# Patient Record
Sex: Female | Born: 1970 | Race: Black or African American | Hispanic: No | Marital: Single | Smoking: Never smoker
Health system: Southern US, Community
[De-identification: ages and names within clinical notes are randomized; demographics above are authoritative.]

## PROBLEM LIST (undated history)

## (undated) DIAGNOSIS — F32A Depression, unspecified: Secondary | ICD-10-CM

## (undated) DIAGNOSIS — F419 Anxiety disorder, unspecified: Secondary | ICD-10-CM

## (undated) DIAGNOSIS — F329 Major depressive disorder, single episode, unspecified: Secondary | ICD-10-CM

## (undated) HISTORY — PX: LAPAROSCOPIC ASSISTED VAGINAL HYSTERECTOMY: SHX5398

## (undated) HISTORY — PX: MYOMECTOMY: SHX85

---

## 1999-05-19 ENCOUNTER — Other Ambulatory Visit: Admission: RE | Admit: 1999-05-19 | Discharge: 1999-05-19 | Payer: Self-pay | Admitting: Obstetrics and Gynecology

## 1999-12-16 ENCOUNTER — Encounter: Payer: Self-pay | Admitting: Emergency Medicine

## 1999-12-16 ENCOUNTER — Emergency Department (HOSPITAL_COMMUNITY): Admission: EM | Admit: 1999-12-16 | Discharge: 1999-12-16 | Payer: Self-pay | Admitting: Emergency Medicine

## 2000-06-06 ENCOUNTER — Other Ambulatory Visit: Admission: RE | Admit: 2000-06-06 | Discharge: 2000-06-06 | Payer: Self-pay | Admitting: Obstetrics and Gynecology

## 2001-06-20 ENCOUNTER — Other Ambulatory Visit: Admission: RE | Admit: 2001-06-20 | Discharge: 2001-06-20 | Payer: Self-pay | Admitting: Obstetrics and Gynecology

## 2002-06-27 ENCOUNTER — Other Ambulatory Visit: Admission: RE | Admit: 2002-06-27 | Discharge: 2002-06-27 | Payer: Self-pay | Admitting: Obstetrics and Gynecology

## 2003-07-08 ENCOUNTER — Other Ambulatory Visit: Admission: RE | Admit: 2003-07-08 | Discharge: 2003-07-08 | Payer: Self-pay | Admitting: Obstetrics and Gynecology

## 2004-06-24 ENCOUNTER — Encounter: Admission: RE | Admit: 2004-06-24 | Discharge: 2004-06-24 | Payer: Self-pay | Admitting: Internal Medicine

## 2004-06-30 ENCOUNTER — Encounter: Admission: RE | Admit: 2004-06-30 | Discharge: 2004-06-30 | Payer: Self-pay | Admitting: Internal Medicine

## 2004-07-13 ENCOUNTER — Encounter: Admission: RE | Admit: 2004-07-13 | Discharge: 2004-07-13 | Payer: Self-pay | Admitting: Internal Medicine

## 2004-08-23 ENCOUNTER — Other Ambulatory Visit: Admission: RE | Admit: 2004-08-23 | Discharge: 2004-08-23 | Payer: Self-pay | Admitting: Obstetrics and Gynecology

## 2005-12-29 ENCOUNTER — Encounter (INDEPENDENT_AMBULATORY_CARE_PROVIDER_SITE_OTHER): Payer: Self-pay | Admitting: Specialist

## 2005-12-29 ENCOUNTER — Inpatient Hospital Stay (HOSPITAL_COMMUNITY): Admission: RE | Admit: 2005-12-29 | Discharge: 2006-01-01 | Payer: Self-pay | Admitting: Obstetrics and Gynecology

## 2009-03-30 ENCOUNTER — Encounter: Admission: RE | Admit: 2009-03-30 | Discharge: 2009-03-30 | Payer: Self-pay | Admitting: Obstetrics and Gynecology

## 2009-04-07 ENCOUNTER — Encounter (INDEPENDENT_AMBULATORY_CARE_PROVIDER_SITE_OTHER): Payer: Self-pay | Admitting: Obstetrics and Gynecology

## 2009-04-07 ENCOUNTER — Ambulatory Visit (HOSPITAL_COMMUNITY): Admission: RE | Admit: 2009-04-07 | Discharge: 2009-04-08 | Payer: Self-pay | Admitting: Obstetrics and Gynecology

## 2010-06-06 LAB — CBC
HCT: 31 % — ABNORMAL LOW (ref 36.0–46.0)
Hemoglobin: 10.4 g/dL — ABNORMAL LOW (ref 12.0–15.0)
Hemoglobin: 12.6 g/dL (ref 12.0–15.0)
MCV: 94.9 fL (ref 78.0–100.0)
MCV: 96.4 fL (ref 78.0–100.0)
Platelets: 200 10*3/uL (ref 150–400)
WBC: 10.7 10*3/uL — ABNORMAL HIGH (ref 4.0–10.5)

## 2010-06-06 LAB — PREGNANCY, URINE: Preg Test, Ur: NEGATIVE

## 2010-08-06 NOTE — Op Note (Signed)
NAMECERISSA, ZEIGER             ACCOUNT NO.:  [id number]   MEDICAL RECORD NO.:  [id number]          PATIENT TYPE:  AMB   LOCATION:  [facility]C                           FACILITY:  [facility]   PHYSICIAN:  Dineen Kid. Rana Snare, M.D.    DATE OF BIRTH:  May 08, 1970   DATE OF PROCEDURE:  12/29/2005  DATE OF DISCHARGE:                                 OPERATIVE REPORT   PREOPERATIVE DIAGNOSES:  1. Pelvic pain.  2. Large fibroids.  3. Menorrhagia.   POSTOPERATIVE DIAGNOSES:  1. Pelvic pain.  2. Large fibroids.  3. Menorrhagia.   PROCEDURE:  Laparotomy with multiple myomectomies.   SURGEON:  Dineen Kid. Rana Snare, M.D.   ASSISTANT:  Duke Salvia. Marcelle Overlie, M.D.   ANESTHESIA:  General endotracheal anesthesia.   INDICATIONS FOR PROCEDURE:  Ms. Makela is a 40 year old G2, P0, A2 with  worsening pelvic pain, known fibroids, worsening menorrhagia currently on  Motrin and Vicodin for pain.  No response to birth control pills.  She  desires definitive surgical intervention.  She does have childbearing  desires and presents for myomectomy.  The risks and benefits were discussed  at length.  Informed consent was obtained.  See history and physical for  further details.   FINDINGS:  Multiple large fibroids, largest 7-8 cm in size,intramural  location at the left fundus, multiple 3-4 cm fibroids, pedunculated  intramural in location.  Normal-appearing fallopian tubes and ovaries.   DESCRIPTION OF PROCEDURE:  After adequate anesthesia, the patient placed in  the supine position.  She was sterilely prepped and draped.  Bladder was  sterilely drained with Foley catheter.  A Pfannenstiel skin incision was  made 2 fingerbreadths above the pubic symphysis, taken down sharply to the  fascia which was incised transversely, extended superiorly then inferiorly  out the bellies of rectus muscle which was separated sharply in the midline.  Peritoneum was entered sharply. Uterus was identified and elevated through  the incision  and dry lap was placed around the uterus.  Multiple fibroids  were identified.  The pedunculated fibroids were removed using Bovie cautery  excising them from the stalk.  The base was coagulated with Bovie cautery,  suture ligated with 0 Monocryl suture in the muscle and a 2-0 PDS  interrupted suture in the serosa.  The large left fundal intramural fibroid  was dissected anteriorly with a Bovie.  The fibroid was grasped with a towel  clip, dissected using sharp dissection.  Once removed, the base was  reapproximated using figure-of-eights of 0 Monocryl suture.  Excessive  serosa was removed and then the serosal incision was closed with a 2-0  running suture of PDS.  Care was taken to avoid the left fallopian tube and  achieve good hemostasis.  Several 2-3 mm intramural fibroids were noted  anteriorly.  They were dissected in similar fashion using Bovie cautery  dissected two of the fibroids, grasped with towel clamps, sharply dissected  at the base, closed, reapproximated the myometrium with 0 Vicryl sutures and  closing serosa with 3-0 in a running fashion and a 3-4 cm one was dissected  in a similar  fashion at the posterior surface.  After careful reexamination,  no residual fibroids could be identified.  Good hemostasis was achieved.  Once dissected, irrigation was used and after a copious amount of irrigation  hemostasis was assured, Interceed adhesive barrier was placed anteriorly and  posteriorly.  Uterus was placed back in the peritoneal cavity.  Peritoneum  was then closed with 2-0 Monocryl in running fashion.  Rectus muscle  plicated in the midline.  The fascia was then closed with two sutures of 0  Vicryl in running fashion.  Irrigation was applied and after adequate  hemostasis, skin staples and Steri-Strips applied.  The patient tolerated  the procedure well and was stable on transfer to the recovery room.  Sponge,  needle and instrument counts were correct x3.   ESTIMATED  BLOOD LOSS:  150 mL.   Patient received 1 g cefoxitin preoperatively.      Dineen Kid Rana Snare, M.D.  Electronically Signed     DCL/MEDQ  D:  12/29/2005  T:  12/30/2005  Job:  [id number]

## 2010-08-06 NOTE — H&P (Signed)
Shirley Hill, Shirley Hill             ACCOUNT NO.:  [id number]   MEDICAL RECORD NO.:  [id number]          PATIENT TYPE:  AMB   LOCATION:  [facility]C                           [facility]ACILITY:  [facility]   PHYSICIAN:  Dineen Kid. Rana Snare, M.D.    DATE OF BIRTH:  1970-08-24   DATE OF ADMISSION:  DATE OF DISCHARGE:                                HISTORY & PHYSICAL   HISTORY OF PRESENT ILLNESS:  Shirley Hill is a 40 year old G2, P0 followed by  me for pelvic pain and uterine fibroids.  She has been having problems with  worsening menorrhagia and pelvic pain, also using Motrin and having tried  birth control pills without much symptomatic relief.  She is also having  back pain.  She does have childbearing desires of the future, although no  immediate desires.  She desires more definitive surgical intervention and  presents today for laparotomy with myomectomy.   Jina's past medical history is negative.  Past surgical history is also  negative.   PAST OB HISTORY:  She has had two pregnancies with two abortions.   MEDICATIONS:  1. Prozac 20 mg daily for general anxiety.  2. Ortho-Cyclen for dysmenorrhea and menorrhagia.   She has no known drug allergies.   PHYSICAL EXAMINATION:  Her blood pressure is 128/72, weight 153.  HEART:  Regular rate and rhythm.  LUNGS:  Clear to auscultation bilaterally.  ABDOMEN:  Nondistended, nontender.  PELVIC EXAM:  She does have a 10-week size irregular uterus which has been  consistent over the last year or two.  Ultrasound evaluation shows multiple  fibroids with the largest measuring 7 x 5.7 which is subserosal on the left.  She has other fibroids measuring 3.7, 3.2, 2.9, 2.0 and 1.8.  She has normal-  appearing ovaries and endometrial thickness of 1 cm.   IMPRESSION:  Pelvic pain, large fibroids, menorrhagia:  All of this has been  unresponsive to conservative medical management which has included  ibuprofen, oral contraceptive agents.  The patient desires more definitive  surgical intervention because it has become socially incapacitating.  She  desires to retain her childbearing abilities so we will plan laparotomy with  myomectomy.  We will discuss the procedure at length which includes but are  not limited to potential complications such as risk of infection, bleeding,  damage to the uterus, tubes, ovaries, bowel or bladder, risks associated  with anesthesia, risks associated with blood loss and blood transfusion, the  possibility that these  may recur, the pain may not improve nor the bleeding may not improve after  the surgery.  The fact that she would require cesarean section if she does  decide to carry children.  She gives her informed consent and wishes to  proceed.  Recently for pain, she has been taking double strength Anaprox and  Vicodin.      Dineen Kid Rana Snare, M.D.  Electronically Signed     DCL/MEDQ  D:  12/28/2005  T:  12/28/2005  Job:  [id number]

## 2010-08-06 NOTE — Discharge Summary (Signed)
Shirley Hill, Shirley Hill             ACCOUNT NO.:  [id number]   MEDICAL RECORD NO.:  [id number]          PATIENT TYPE:  INP   LOCATION:  9303                          [facility]ACILITY:  [facility]   PHYSICIAN:  Dineen Kid. Rana Snare, M.D.    DATE OF BIRTH:  08/01/1970   DATE OF ADMISSION:  12/29/2005  DATE OF DISCHARGE:  01/01/2006                                 DISCHARGE SUMMARY   HISTORY OF PRESENT ILLNESS:  [name] is a 40 year old G2, P0, followed  by me for pelvic pain, uterine fibroids, worsening menorrhagia and pelvic  pain over the last year without much symptomatic relief on birth control  pills or with Naprosyn.  Currently using Vicodin and Naprosyn for pelvic  pain.  She does have multiple fibroids based on ultrasound, largest measures  7 cm in size.  She desires to undergo surgical intervention.  Although she  does not have immediate child bearing desires, she does not wish  hysterectomy and presents for myomectomy.   HOSPITAL COURSE:  Same day of admission, the patient underwent a laparotomy  with multiple myomectomy with largest fibroid measuring 7-8 cm in size and  multiple 3-4 cm fibroids.  The surgery was uncomplicated.  Her estimated  blood loss was 150 cc.  Postoperative care was fairly routine with return of  bowel function and able to ambulate by the second or third day of her  postoperative care.  On postop day #1, her hemoglobin was 10.3.  By postop  day #3, she was tolerating a regular diet, ambulating without difficulty,  having regular bowel movements.  Her incision was clean, dry and intact with  normoactive bowel sounds.  Staples were removed and the patient was  discharged home.   DISPOSITION:  The patient was discharged home to follow in the office in two  weeks.  Sent home with routine instruction sheet for laparotomy, no heavy  lifting, return for increased pain, fever or bleeding.  She as given a  prescription for Tylox #30.      Dineen Kid Rana Snare, M.D.  Electronically  Signed     DCL/MEDQ  D:  01/01/2006  T:  01/02/2006  Job:  [id number]

## 2011-10-26 ENCOUNTER — Other Ambulatory Visit: Payer: Self-pay | Admitting: Obstetrics and Gynecology

## 2011-10-26 DIAGNOSIS — N63 Unspecified lump in unspecified breast: Secondary | ICD-10-CM

## 2011-11-01 ENCOUNTER — Ambulatory Visit
Admission: RE | Admit: 2011-11-01 | Discharge: 2011-11-01 | Disposition: A | Discharge status: Home / Self Care | Payer: 59 | Source: Ambulatory Visit | Attending: Obstetrics and Gynecology | Admitting: Obstetrics and Gynecology

## 2011-11-01 DIAGNOSIS — N63 Unspecified lump in unspecified breast: Secondary | ICD-10-CM

## 2012-05-01 ENCOUNTER — Other Ambulatory Visit: Payer: Self-pay | Admitting: Obstetrics and Gynecology

## 2012-05-01 DIAGNOSIS — N63 Unspecified lump in unspecified breast: Secondary | ICD-10-CM

## 2012-05-14 ENCOUNTER — Ambulatory Visit
Admission: RE | Admit: 2012-05-14 | Discharge: 2012-05-14 | Disposition: A | Discharge status: Home / Self Care | Payer: 59 | Source: Ambulatory Visit | Attending: Obstetrics and Gynecology | Admitting: Obstetrics and Gynecology

## 2012-05-14 DIAGNOSIS — N63 Unspecified lump in unspecified breast: Secondary | ICD-10-CM

## 2012-11-28 ENCOUNTER — Other Ambulatory Visit: Payer: Self-pay | Admitting: Obstetrics and Gynecology

## 2012-11-28 DIAGNOSIS — N63 Unspecified lump in unspecified breast: Secondary | ICD-10-CM

## 2012-12-04 ENCOUNTER — Ambulatory Visit
Admission: RE | Admit: 2012-12-04 | Discharge: 2012-12-04 | Disposition: A | Discharge status: Home / Self Care | Payer: 59 | Source: Ambulatory Visit | Attending: Obstetrics and Gynecology | Admitting: Obstetrics and Gynecology

## 2012-12-04 DIAGNOSIS — N63 Unspecified lump in unspecified breast: Secondary | ICD-10-CM

## 2013-05-10 ENCOUNTER — Other Ambulatory Visit: Payer: Self-pay | Admitting: Obstetrics and Gynecology

## 2013-05-10 DIAGNOSIS — N6310 Unspecified lump in the right breast, unspecified quadrant: Secondary | ICD-10-CM

## 2013-05-23 ENCOUNTER — Ambulatory Visit
Admission: RE | Admit: 2013-05-23 | Discharge: 2013-05-23 | Disposition: A | Discharge status: Home / Self Care | Payer: 59 | Source: Ambulatory Visit | Attending: Obstetrics and Gynecology | Admitting: Obstetrics and Gynecology

## 2013-05-23 DIAGNOSIS — N6310 Unspecified lump in the right breast, unspecified quadrant: Secondary | ICD-10-CM

## 2013-11-27 ENCOUNTER — Other Ambulatory Visit: Payer: Self-pay | Admitting: Obstetrics and Gynecology

## 2013-11-29 LAB — CYTOLOGY - PAP

## 2014-05-06 ENCOUNTER — Other Ambulatory Visit: Payer: Self-pay

## 2014-05-06 DIAGNOSIS — Z1231 Encounter for screening mammogram for malignant neoplasm of breast: Secondary | ICD-10-CM

## 2014-05-26 ENCOUNTER — Ambulatory Visit
Admission: RE | Admit: 2014-05-26 | Discharge: 2014-05-26 | Disposition: A | Discharge status: Home / Self Care | Payer: 59 | Source: Ambulatory Visit

## 2014-05-26 ENCOUNTER — Other Ambulatory Visit: Payer: Self-pay

## 2014-05-26 DIAGNOSIS — Z1231 Encounter for screening mammogram for malignant neoplasm of breast: Secondary | ICD-10-CM

## 2014-12-02 ENCOUNTER — Other Ambulatory Visit: Payer: Self-pay | Admitting: Obstetrics and Gynecology

## 2014-12-03 ENCOUNTER — Other Ambulatory Visit: Payer: Self-pay | Admitting: Obstetrics and Gynecology

## 2014-12-03 DIAGNOSIS — N858 Other specified noninflammatory disorders of uterus: Secondary | ICD-10-CM

## 2014-12-03 LAB — CYTOLOGY - PAP

## 2014-12-08 ENCOUNTER — Ambulatory Visit
Admission: RE | Admit: 2014-12-08 | Discharge: 2014-12-08 | Disposition: A | Discharge status: Home / Self Care | Payer: 59 | Source: Ambulatory Visit | Attending: Obstetrics and Gynecology | Admitting: Obstetrics and Gynecology

## 2014-12-08 DIAGNOSIS — N858 Other specified noninflammatory disorders of uterus: Secondary | ICD-10-CM

## 2014-12-08 MED ORDER — IOPAMIDOL (ISOVUE-300) INJECTION 61%
100.0000 mL | Freq: Once | INTRAVENOUS | Status: AC | PRN
  Administered 2014-12-08: 100 mL via INTRAVENOUS

## 2014-12-29 ENCOUNTER — Other Ambulatory Visit: Payer: Self-pay | Admitting: General Surgery

## 2014-12-29 DIAGNOSIS — R161 Splenomegaly, not elsewhere classified: Secondary | ICD-10-CM

## 2014-12-29 DIAGNOSIS — R160 Hepatomegaly, not elsewhere classified: Secondary | ICD-10-CM

## 2014-12-29 DIAGNOSIS — N9489 Other specified conditions associated with female genital organs and menstrual cycle: Secondary | ICD-10-CM

## 2015-01-04 ENCOUNTER — Ambulatory Visit
Admission: RE | Admit: 2015-01-04 | Discharge: 2015-01-04 | Disposition: A | Discharge status: Home / Self Care | Payer: 59 | Source: Ambulatory Visit | Attending: General Surgery | Admitting: General Surgery

## 2015-01-04 DIAGNOSIS — N9489 Other specified conditions associated with female genital organs and menstrual cycle: Secondary | ICD-10-CM

## 2015-01-04 DIAGNOSIS — R160 Hepatomegaly, not elsewhere classified: Secondary | ICD-10-CM

## 2015-01-04 DIAGNOSIS — R161 Splenomegaly, not elsewhere classified: Secondary | ICD-10-CM

## 2015-01-04 MED ORDER — GADOBENATE DIMEGLUMINE 529 MG/ML IV SOLN
13.0000 mL | Freq: Once | INTRAVENOUS | Status: AC | PRN
  Administered 2015-01-04: 13 mL via INTRAVENOUS

## 2015-01-05 NOTE — Progress Notes (Signed)
Quick Note:  Please let patient know that the liver and spleen lesions look like totally benign cysts that don't need surgery or follow up imaging.  The pelvic mass looks like two fibroids on the tissue near where the uterus used to be. As to what to do with those, that would be up to her, Dr. Denman George with gyn onc, and her regular GYN, Dr. Corinna Capra. ______

## 2015-01-09 ENCOUNTER — Telehealth: Payer: Self-pay

## 2015-01-09 NOTE — Telephone Encounter (Signed)
Incoming call from [place]: [phone] referring office that the patient's surgery has been cancelled. Reason for cancellation , patient will follow up with her GYN and the "mass" was benign. No further

## 2015-01-15 ENCOUNTER — Ambulatory Visit: Payer: 59 | Admitting: Gynecologic Oncology

## 2015-03-26 NOTE — Patient Instructions (Addendum)
Your procedure is scheduled on:  Tuesday, Jan. 10, 2017  Enter through the Main Entrance of [facility] at:  6:00 A.M.  Pick up the phone at the desk and dial [phone].  Call this number if you have problems the morning of surgery: [phone].  Remember: Do NOT eat food or drink after:  Midnight Monday Take these medicines the morning of surgery with a SIP OF WATER:  None  Do NOT wear jewelry (body piercing), metal hair clips/bobby pins, make-up, or nail polish. Do NOT wear lotions, powders, or perfumes.  You may wear deoderant. Do NOT shave for 48 hours prior to surgery. Do NOT bring valuables to the hospital. Contacts, dentures, or bridgework may not be worn into surgery. Leave suitcase in car.  After surgery it may be brought to your room.  For patients admitted to the hospital, checkout time is 11:00 AM the day of discharge.

## 2015-03-27 ENCOUNTER — Encounter (HOSPITAL_COMMUNITY): Payer: Self-pay

## 2015-03-27 ENCOUNTER — Encounter (HOSPITAL_COMMUNITY)
Admission: RE | Admit: 2015-03-27 | Discharge: 2015-03-27 | Disposition: A | Discharge status: Home / Self Care | Payer: 59 | Source: Ambulatory Visit | Attending: Obstetrics and Gynecology | Admitting: Obstetrics and Gynecology

## 2015-03-27 DIAGNOSIS — Z01812 Encounter for preprocedural laboratory examination: Secondary | ICD-10-CM | POA: Diagnosis not present

## 2015-03-27 DIAGNOSIS — R1909 Other intra-abdominal and pelvic swelling, mass and lump: Secondary | ICD-10-CM | POA: Diagnosis not present

## 2015-03-27 HISTORY — DX: Anxiety disorder, unspecified: F41.9

## 2015-03-27 HISTORY — DX: Major depressive disorder, single episode, unspecified: F32.9

## 2015-03-27 HISTORY — DX: Depression, unspecified: F32.A

## 2015-03-27 LAB — CBC
HCT: 34.9 % — ABNORMAL LOW (ref 36.0–46.0)
Hemoglobin: 11.8 g/dL — ABNORMAL LOW (ref 12.0–15.0)
MCH: 31.1 pg (ref 26.0–34.0)
MCHC: 33.8 g/dL (ref 30.0–36.0)
MCV: 92.1 fL (ref 78.0–100.0)
Platelets: 249 10*3/uL (ref 150–400)
RBC: 3.79 MIL/uL — ABNORMAL LOW (ref 3.87–5.11)
RDW: 14.1 % (ref 11.5–15.5)
WBC: 8.9 10*3/uL (ref 4.0–10.5)

## 2015-03-30 MED ORDER — DEXTROSE 5 % IV SOLN
2.0000 g | INTRAVENOUS | Status: AC
  Administered 2015-03-31: 2 g via INTRAVENOUS
  Filled 2015-03-30: qty 2

## 2015-03-30 NOTE — H&P (Addendum)
  Shirley Hill [id number] 03/27/2015 S:  Shirley Hill presents today for a preop evaluation for laparotomy and evaluation of right adnexal mass.  Shirley Hill was recently here for an annual exam in September, was not having any problems at all.  She did have a hysterectomy in the past for fibroids.  On pelvic exam, we felt a moderate-sized adnexal mass 3 to 4 cm in size in the right adnexa which was nontender.  Heart was regular rate and rhythm.  Lungs are clear to auscultation bilaterally.  Abdomen was  nondistended and nontender. .  We did proceed with an ultrasound evaluation that showed a 4 cm adnexal mass consistent with a fibroid.  She had followup with a general surgeon and also ended up with a CAT scan and MRI.  The MRI of the pelvis showed a 4.5 x 4.3 cm broad ligament fibroid and also a 2.3 cm broad ligament fibroid in addition to her ovary.  There is no evidence of any type of malignancy.  No lymphadenopathy.  No suspicions for any type of metastatic disease.  After discussion with the general surgeon and evaluation by the general surgeon, also discussion with GYN oncologist, they did recommend surgical evaluation and removal of these, and she presents for this.    A/P:  Right adnexal masses status post hysterectomy most consistent with broad ligament fibroids.  Plan laparotomy with right salpingo-oophorectomy and removal of adnexal masses.  Discussed the risks and benefits, the pros and cons, recovery of this, and all of her questions were answered.  She does give her informed consent.   Louretta Shorten, MD/[id number]  03/31/15 0715 This patient has been seen and examined.   All of her questions were answered.  Labs and vital signs reviewed.  Informed consent has been obtained.  The History and Physical is current. DL

## 2015-03-31 ENCOUNTER — Encounter (HOSPITAL_COMMUNITY)
Admission: RE | Disposition: A | Discharge status: Home / Self Care | Payer: Self-pay | Source: Ambulatory Visit | Attending: Obstetrics and Gynecology

## 2015-03-31 ENCOUNTER — Encounter (HOSPITAL_COMMUNITY): Payer: Self-pay

## 2015-03-31 ENCOUNTER — Inpatient Hospital Stay (HOSPITAL_COMMUNITY)
Admission: RE | Admit: 2015-03-31 | Discharge: 2015-04-02 | DRG: 743 | Disposition: A | Discharge status: Home / Self Care | Payer: 59 | Source: Ambulatory Visit | Attending: Obstetrics and Gynecology | Admitting: Obstetrics and Gynecology

## 2015-03-31 ENCOUNTER — Inpatient Hospital Stay (HOSPITAL_COMMUNITY): Payer: 59 | Admitting: Anesthesiology

## 2015-03-31 DIAGNOSIS — D270 Benign neoplasm of right ovary: Secondary | ICD-10-CM | POA: Diagnosis present

## 2015-03-31 DIAGNOSIS — R1900 Intra-abdominal and pelvic swelling, mass and lump, unspecified site: Secondary | ICD-10-CM | POA: Diagnosis present

## 2015-03-31 DIAGNOSIS — Z90710 Acquired absence of both cervix and uterus: Secondary | ICD-10-CM

## 2015-03-31 DIAGNOSIS — Z9889 Other specified postprocedural states: Secondary | ICD-10-CM

## 2015-03-31 HISTORY — PX: LAPAROTOMY: SHX154

## 2015-03-31 HISTORY — PX: SALPINGOOPHORECTOMY: SHX82

## 2015-03-31 SURGERY — LAPAROTOMY, EXPLORATORY
Anesthesia: General | Site: Abdomen | Laterality: Right

## 2015-03-31 MED ORDER — SCOPOLAMINE 1 MG/3DAYS TD PT72
MEDICATED_PATCH | TRANSDERMAL | Status: AC
  Administered 2015-03-31: 1.5 mg via TRANSDERMAL
  Filled 2015-03-31: qty 1

## 2015-03-31 MED ORDER — SUGAMMADEX SODIUM 500 MG/5ML IV SOLN
INTRAVENOUS | Status: AC
  Filled 2015-03-31: qty 5

## 2015-03-31 MED ORDER — HYDROMORPHONE HCL 1 MG/ML IJ SOLN: 0.2000 mg | INTRAMUSCULAR | Status: DC | PRN

## 2015-03-31 MED ORDER — HYDROMORPHONE HCL 1 MG/ML IJ SOLN
INTRAMUSCULAR | Status: AC
  Administered 2015-03-31: 0.5 mg via INTRAVENOUS
  Filled 2015-03-31: qty 1

## 2015-03-31 MED ORDER — MIDAZOLAM HCL 2 MG/2ML IJ SOLN
INTRAMUSCULAR | Status: DC | PRN
  Administered 2015-03-31: 2 mg via INTRAVENOUS

## 2015-03-31 MED ORDER — DIPHENHYDRAMINE HCL 50 MG/ML IJ SOLN: 12.5000 mg | Freq: Four times a day (QID) | INTRAMUSCULAR | Status: DC | PRN

## 2015-03-31 MED ORDER — NALOXONE HCL 0.4 MG/ML IJ SOLN: 0.4000 mg | INTRAMUSCULAR | Status: DC | PRN

## 2015-03-31 MED ORDER — PROPOFOL 10 MG/ML IV BOLUS
INTRAVENOUS | Status: DC | PRN
  Administered 2015-03-31: 20 mg via INTRAVENOUS

## 2015-03-31 MED ORDER — DIPHENHYDRAMINE HCL 12.5 MG/5ML PO ELIX: 12.5000 mg | ORAL_SOLUTION | Freq: Four times a day (QID) | ORAL | Status: DC | PRN

## 2015-03-31 MED ORDER — LACTATED RINGERS IV SOLN
INTRAVENOUS | Status: DC
  Administered 2015-03-31 (×2): via INTRAVENOUS

## 2015-03-31 MED ORDER — MIDAZOLAM HCL 2 MG/2ML IJ SOLN
INTRAMUSCULAR | Status: AC
  Filled 2015-03-31: qty 2

## 2015-03-31 MED ORDER — HYDROMORPHONE HCL 1 MG/ML IJ SOLN
0.2500 mg | INTRAMUSCULAR | Status: DC | PRN
  Administered 2015-03-31 (×3): 0.5 mg via INTRAVENOUS

## 2015-03-31 MED ORDER — LIDOCAINE HCL (CARDIAC) 20 MG/ML IV SOLN
INTRAVENOUS | Status: AC
  Filled 2015-03-31: qty 5

## 2015-03-31 MED ORDER — SCOPOLAMINE 1 MG/3DAYS TD PT72
1.0000 | MEDICATED_PATCH | Freq: Once | TRANSDERMAL | Status: DC
  Administered 2015-03-31: 1.5 mg via TRANSDERMAL

## 2015-03-31 MED ORDER — MENTHOL 3 MG MT LOZG: 1.0000 | LOZENGE | OROMUCOSAL | Status: DC | PRN

## 2015-03-31 MED ORDER — ONDANSETRON HCL 4 MG/2ML IJ SOLN: 4.0000 mg | Freq: Four times a day (QID) | INTRAMUSCULAR | Status: DC | PRN

## 2015-03-31 MED ORDER — ZOLPIDEM TARTRATE 5 MG PO TABS: 5.0000 mg | ORAL_TABLET | Freq: Every evening | ORAL | Status: DC | PRN

## 2015-03-31 MED ORDER — DEXAMETHASONE SODIUM PHOSPHATE 4 MG/ML IJ SOLN
INTRAMUSCULAR | Status: AC
  Filled 2015-03-31: qty 1

## 2015-03-31 MED ORDER — IBUPROFEN 600 MG PO TABS
600.0000 mg | ORAL_TABLET | Freq: Four times a day (QID) | ORAL | Status: DC | PRN
  Administered 2015-04-02: 600 mg via ORAL
  Filled 2015-03-31: qty 1

## 2015-03-31 MED ORDER — SODIUM CHLORIDE 0.9 % IJ SOLN: 9.0000 mL | INTRAMUSCULAR | Status: DC | PRN

## 2015-03-31 MED ORDER — HYDROMORPHONE 1 MG/ML IV SOLN
INTRAVENOUS | Status: DC
  Administered 2015-03-31: 2.8 mg via INTRAVENOUS
  Administered 2015-03-31: 11:00:00 via INTRAVENOUS
  Administered 2015-04-01: 1 mg via INTRAVENOUS
  Administered 2015-04-01: 1.4 mg via INTRAVENOUS
  Filled 2015-03-31: qty 25

## 2015-03-31 MED ORDER — KETOROLAC TROMETHAMINE 30 MG/ML IJ SOLN
INTRAMUSCULAR | Status: AC
  Filled 2015-03-31: qty 1

## 2015-03-31 MED ORDER — OXYCODONE-ACETAMINOPHEN 5-325 MG PO TABS
1.0000 | ORAL_TABLET | ORAL | Status: DC | PRN
  Administered 2015-04-01 – 2015-04-02 (×6): 2 via ORAL
  Filled 2015-03-31 (×6): qty 2

## 2015-03-31 MED ORDER — FENTANYL CITRATE (PF) 100 MCG/2ML IJ SOLN
INTRAMUSCULAR | Status: DC | PRN
  Administered 2015-03-31: 50 ug via INTRAVENOUS
  Administered 2015-03-31: 25 ug via INTRAVENOUS
  Administered 2015-03-31: 100 ug via INTRAVENOUS
  Administered 2015-03-31: 50 ug via INTRAVENOUS
  Administered 2015-03-31 (×2): 100 ug via INTRAVENOUS

## 2015-03-31 MED ORDER — ESCITALOPRAM OXALATE 5 MG PO TABS
5.0000 mg | ORAL_TABLET | ORAL | Status: DC | PRN
  Filled 2015-03-31: qty 1

## 2015-03-31 MED ORDER — LIDOCAINE HCL (CARDIAC) 20 MG/ML IV SOLN
INTRAVENOUS | Status: DC | PRN
  Administered 2015-03-31: 100 mg via INTRAVENOUS
  Administered 2015-03-31: 180 mg via INTRAVENOUS

## 2015-03-31 MED ORDER — ONDANSETRON HCL 4 MG/2ML IJ SOLN
INTRAMUSCULAR | Status: DC | PRN
  Administered 2015-03-31: 4 mg via INTRAVENOUS

## 2015-03-31 MED ORDER — DEXTROSE-NACL 5-0.45 % IV SOLN
INTRAVENOUS | Status: DC
  Administered 2015-03-31 (×2): via INTRAVENOUS
  Administered 2015-04-01: 125 mL/h via INTRAVENOUS

## 2015-03-31 MED ORDER — CYCLOBENZAPRINE HCL 10 MG PO TABS
10.0000 mg | ORAL_TABLET | Freq: Three times a day (TID) | ORAL | Status: DC | PRN
  Filled 2015-03-31: qty 1

## 2015-03-31 MED ORDER — ROCURONIUM BROMIDE 100 MG/10ML IV SOLN
INTRAVENOUS | Status: DC | PRN
  Administered 2015-03-31: 50 mg via INTRAVENOUS

## 2015-03-31 MED ORDER — ONDANSETRON HCL 4 MG/2ML IJ SOLN
INTRAMUSCULAR | Status: AC
  Filled 2015-03-31: qty 2

## 2015-03-31 MED ORDER — DEXAMETHASONE SODIUM PHOSPHATE 10 MG/ML IJ SOLN
INTRAMUSCULAR | Status: DC | PRN
  Administered 2015-03-31: 10 mg via INTRAVENOUS

## 2015-03-31 MED ORDER — SUGAMMADEX SODIUM 200 MG/2ML IV SOLN
INTRAVENOUS | Status: DC | PRN
  Administered 2015-03-31: 80 mg via INTRAVENOUS
  Administered 2015-03-31 (×2): 100 mg via INTRAVENOUS

## 2015-03-31 MED ORDER — ROCURONIUM BROMIDE 100 MG/10ML IV SOLN
INTRAVENOUS | Status: AC
  Filled 2015-03-31: qty 1

## 2015-03-31 MED ORDER — FENTANYL CITRATE (PF) 250 MCG/5ML IJ SOLN
INTRAMUSCULAR | Status: AC
  Filled 2015-03-31: qty 5

## 2015-03-31 MED ORDER — HYDROMORPHONE HCL 1 MG/ML IJ SOLN
INTRAMUSCULAR | Status: AC
  Filled 2015-03-31: qty 1

## 2015-03-31 MED ORDER — KETOROLAC TROMETHAMINE 30 MG/ML IJ SOLN
INTRAMUSCULAR | Status: DC | PRN
  Administered 2015-03-31: 30 mg via INTRAVENOUS

## 2015-03-31 MED ORDER — PROPOFOL 10 MG/ML IV BOLUS
INTRAVENOUS | Status: AC
  Filled 2015-03-31: qty 20

## 2015-03-31 SURGICAL SUPPLY — 45 items
APL SKNCLS STERI-STRIP NONHPOA (GAUZE/BANDAGES/DRESSINGS) ×1
BARRIER ADHS 3X4 INTERCEED (GAUZE/BANDAGES/DRESSINGS) IMPLANT
BENZOIN TINCTURE PRP APPL 2/3 (GAUZE/BANDAGES/DRESSINGS) ×1 IMPLANT
BLADE SURG 10 STRL SS (BLADE) ×2 IMPLANT
BRR ADH 4X3 ABS CNTRL BYND (GAUZE/BANDAGES/DRESSINGS)
CANISTER SUCT 3000ML (MISCELLANEOUS) ×2 IMPLANT
CELLS DAT CNTRL 66122 CELL SVR (MISCELLANEOUS) ×1 IMPLANT
CLOTH BEACON ORANGE TIMEOUT ST (SAFETY) ×2 IMPLANT
DECANTER SPIKE VIAL GLASS SM (MISCELLANEOUS) IMPLANT
DRAPE WARM FLUID 44X44 (DRAPE) IMPLANT
DRSG OPSITE POSTOP 4X10 (GAUZE/BANDAGES/DRESSINGS) ×1 IMPLANT
GAUZE SPONGE 4X4 16PLY XRAY LF (GAUZE/BANDAGES/DRESSINGS) IMPLANT
GLOVE BIO SURGEON STRL SZ8 (GLOVE) ×2 IMPLANT
GLOVE BIOGEL PI IND STRL 7.0 (GLOVE) ×1 IMPLANT
GLOVE BIOGEL PI INDICATOR 7.0 (GLOVE) ×1
GLOVE SURG ORTHO 8.0 STRL STRW (GLOVE) ×2 IMPLANT
GOWN STRL REUS W/TWL LRG LVL3 (GOWN DISPOSABLE) ×4 IMPLANT
NEEDLE HYPO 22GX1.5 SAFETY (NEEDLE) IMPLANT
NS IRRIG 1000ML POUR BTL (IV SOLUTION) ×2 IMPLANT
PACK ABDOMINAL GYN (CUSTOM PROCEDURE TRAY) ×2 IMPLANT
PAD OB MATERNITY 4.3X12.25 (PERSONAL CARE ITEMS) ×2 IMPLANT
PENCIL SMOKE EVAC W/HOLSTER (ELECTROSURGICAL) ×1 IMPLANT
PROTECTOR NERVE ULNAR (MISCELLANEOUS) ×2 IMPLANT
RETRACTOR WND ALEXIS 18 MED (MISCELLANEOUS) IMPLANT
RETRACTOR WND ALEXIS 25 LRG (MISCELLANEOUS) IMPLANT
RTRCTR WOUND ALEXIS 18CM MED (MISCELLANEOUS) ×2
RTRCTR WOUND ALEXIS 25CM LRG (MISCELLANEOUS)
SPONGE LAP 18X18 X RAY DECT (DISPOSABLE) ×1 IMPLANT
STAPLER VISISTAT 35W (STAPLE) ×1 IMPLANT
STRIP CLOSURE SKIN 1/2X4 (GAUZE/BANDAGES/DRESSINGS) ×1 IMPLANT
SUT CHROMIC 3 0 SH 27 (SUTURE) IMPLANT
SUT CHROMIC 3 0 TIES (SUTURE) IMPLANT
SUT MON AB 4-0 PS1 27 (SUTURE) ×1 IMPLANT
SUT PDS AB 3-0 SH 27 (SUTURE) IMPLANT
SUT VIC AB 0 CT1 18XCR BRD8 (SUTURE) IMPLANT
SUT VIC AB 0 CT1 8-18 (SUTURE)
SUT VIC AB 1 CT1 27 (SUTURE) ×4
SUT VIC AB 1 CT1 27XBRD ANTBC (SUTURE) IMPLANT
SUT VIC AB 1 CTB1 27 (SUTURE) ×2 IMPLANT
SUT VIC AB 2-0 CT1 (SUTURE) ×2 IMPLANT
SUT VICRYL 0 TIES 12 18 (SUTURE) ×2 IMPLANT
SYR CONTROL 10ML LL (SYRINGE) IMPLANT
TOWEL OR 17X24 6PK STRL BLUE (TOWEL DISPOSABLE) ×4 IMPLANT
TRAY FOLEY CATH SILVER 14FR (SET/KITS/TRAYS/PACK) ×2 IMPLANT
WATER STERILE IRR 1000ML POUR (IV SOLUTION) ×1 IMPLANT

## 2015-03-31 NOTE — Anesthesia Postprocedure Evaluation (Signed)
Anesthesia Post Note  Patient: Shirley Hill  Procedure(s) Performed: Procedure(s) (LRB): EXPLORATORY LAPAROTOMY WITH REMOVAL OF RIGHT ADNEXAL MASSES (Right) RIGHT SALPINGO OOPHORECTOMY (Right)  Patient location during evaluation: PACU Anesthesia Type: General Level of consciousness: awake Pain management: pain level controlled Respiratory status: spontaneous breathing Cardiovascular status: stable Anesthetic complications: no    Last Vitals:  Filed Vitals:   03/31/15 0845 03/31/15 0900  BP: 141/95 139/80  Pulse: 69 70  Temp:    Resp: 17 16    Last Pain:  Filed Vitals:   03/31/15 0921  PainSc: 5                  EDWARDS,[name]

## 2015-03-31 NOTE — Anesthesia Procedure Notes (Signed)
Procedure Name: Intubation Date/Time: 03/31/2015 7:37 AM Performed by: Casimer Lanius A Pre-anesthesia Checklist: Patient identified, Emergency Drugs available, Suction available and Patient being monitored Patient Re-evaluated:Patient Re-evaluated prior to inductionOxygen Delivery Method: Circle system utilized and Simple face mask Preoxygenation: Pre-oxygenation with 100% oxygen Intubation Type: IV induction Ventilation: Mask ventilation without difficulty Laryngoscope Size: Mac and 3 Grade View: Grade II Tube type: Oral Tube size: 7.0 mm Number of attempts: 1 Airway Equipment and Method: Stylet Placement Confirmation: ETT inserted through vocal cords under direct vision,  positive ETCO2 and breath sounds checked- equal and bilateral Secured at: 21 (right lip) cm Tube secured with: Tape Dental Injury: Teeth and Oropharynx as per pre-operative assessment

## 2015-03-31 NOTE — Op Note (Signed)
[name], Shirley NO.:  [id number]  MEDICAL RECORD NO.:  [id number]  LOCATION:  [facility]                          FACILITY:  [place]  PHYSICIAN:  Monia Sabal. Corinna Capra, M.D.    DATE OF BIRTH:  [birth year]  DATE OF PROCEDURE:  03/31/2015 DATE OF DISCHARGE:                              OPERATIVE REPORT   PREOPERATIVE DIAGNOSIS:  Right adnexal masses status post hysterectomy consistent with broad ligament fibroids.  POSTOPERATIVE DIAGNOSIS:  Right adnexal masses status post hysterectomy consistent with broad ligament fibroids.  PROCEDURE:  Laparotomy with right salpingo-oophorectomy and removal of right adnexal masses.  SURGEON:  Monia Sabal. Corinna Capra, M.D.  ASSISTANT:  Dr. Enzo Bi.  ANESTHESIA:  General endotracheal.  INDICATIONS:  Shirley Hill is a 45 year old black female with a history of myomectomy in the past, also a hysterectomy, having no problems until she had recent pelvic exam on her annual exam and noted two right adnexal masses with ultrasound, and also CT and MRI of the pelvis showing a 4.5-cm broad ligament fibroid and also a 2.3-cm broad ligament fibroid in addition to her right ovary.  She presents for definitive surgical evaluation and removal of these, plan out laparotomy with removal of the adnexal mass in the right tube and ovary.  Risks and benefits of the procedure were discussed at length and informed consent was obtained.  FINDINGS AT THE TIME OF SURGERY:  Normal-appearing appendix.  Normal- appearing left tube and ovary.  Right tube and ovary appeared to be normal.  There were 2 right broad ligament masses consistent with leiomyoma.  DESCRIPTION OF PROCEDURE:  After adequate analgesia, the patient was placed in a supine position.  She was sterilely prepped and draped. Bladder sterilely drained with a Foley catheter.  A sterile sponge was placed intravaginally.  A Pfannenstiel skin incision was made 2 fingerbreadths above the pubic symphysis and  taken down sharply.  The fascia was incised transversely and extended superiorly and inferiorly up the belly of the rectus muscle which were separated sharply in the midline.  Peritoneum was entered sharply.  An Fredia Sorrow retractor was placed.  Bowel was packed cephalad.  The right round ligament was identified, grasped, suture ligated, and the right broad ligament was opened identifying the fibroids.  The large fibroid which was grasped with a tenaculum was elevated and was bluntly and sharply dissected free from the broad ligament.  The base was then clamped with a Haney clamp, and it was removed.  The blood supply was then ligated with 0 Vicryl suture and the second fibroid was similarly identified next to the right ovary.  It was grasped with the right ovary and the infundibulopelvic ligament was identified, clamped with a Heaney clamp across the IFP and removed.  The infundibulopelvic ligament was then ligated with 2 sutures of 0 Vicryl with good hemostasis noted.  Careful evaluation of the right adnexa at this time revealed good hemostasis. No evidence of any other fibroids or any other type of pathology.  We were well away from the ureter, and we were able to palpate and visualize the right ureter with good peristalsis away from the line of dissection.  Left tube and ovary were  identified and appeared to be normal.  No evidence of left broad ligament fibroids were noted. Evaluation of the bladder and the rectum revealed normal anatomy.  The appendix appeared to be normal.  The pelvis was then irrigated.  The hemostasis was excellent.  The packing was then removed.  The retractors were removed.  The peritoneum was closed with 0 Vicryl in a running fashion.  The rectus muscle plicated in the midline.  The fascia was then closed with a running suture of #1 Vicryl.  Irrigation was applied after adequate hemostasis.  The skin was closed with a 4-0 Monocryl subcutaneous suture  with good approximation and good hemostasis noted. Tegaderm was then placed, and the patient was transferred to recovery room in stable condition.  Sponge and instrument count was normal x3. Estimated blood loss was less than 50 mL.  The patient received 2 g of cefotetan preoperatively and 30 mg of Toradol IV intraoperatively.     Monia Sabal Corinna Capra, M.D.     DCL/MEDQ  D:  03/31/2015  T:  03/31/2015  Job:  [id number]

## 2015-03-31 NOTE — Addendum Note (Signed)
Addendum  created 03/31/15 2115 by Flossie Dibble, CRNA   Modules edited: Notes Section   Notes Section:  File: [id number]

## 2015-03-31 NOTE — Brief Op Note (Signed)
03/31/2015  8:22 AM  PATIENT:  Shirley Hill  45 y.o. female  PRE-OPERATIVE DIAGNOSIS:  right broad ligament fibroids  POST-OPERATIVE DIAGNOSIS:  right broad ligament fibroids  PROCEDURE:  Procedure(s): EXPLORATORY LAPAROTOMY WITH REMOVAL OF RIGHT ADNEXAL MASSES (Right) RIGHT SALPINGO OOPHORECTOMY (Right)  SURGEON:  Surgeon(s) and Role:    * Louretta Shorten, MD - Primary    * Linda Hedges, DO - Assisting  PHYSICIAN ASSISTANT:   ASSISTANTS: Morris   ANESTHESIA:   general  EBL:  Total I/O In: 1000 [I.V.:1000] Out: 150 [Urine:100; Blood:50]  BLOOD ADMINISTERED:none  DRAINS: Urinary Catheter (Foley)   LOCAL MEDICATIONS USED:  NONE  SPECIMEN:  Source of Specimen:  Right broad ligament masses and right tube and ovary  DISPOSITION OF SPECIMEN:  PATHOLOGY  COUNTS:  YES  TOURNIQUET:  * No tourniquets in log *  DICTATION: .Other Dictation: Dictation Number 1  PLAN OF CARE: Admit to inpatient   PATIENT DISPOSITION:  PACU - hemodynamically stable.   Delay start of Pharmacological VTE agent (>24hrs) due to surgical blood loss or risk of bleeding: not applicable

## 2015-03-31 NOTE — Anesthesia Preprocedure Evaluation (Signed)
Anesthesia Evaluation  Patient identified by MRN, date of birth, ID band Patient awake    Reviewed: Allergy & Precautions, NPO status   Airway Mallampati: II  TM Distance: >3 FB Neck ROM: Full    Dental   Pulmonary neg pulmonary ROS,    breath sounds clear to auscultation       Cardiovascular negative cardio ROS   Rhythm:Regular Rate:Normal     Neuro/Psych    GI/Hepatic negative GI ROS, Neg liver ROS,   Endo/Other  negative endocrine ROS  Renal/GU negative Renal ROS     Musculoskeletal   Abdominal   Peds  Hematology   Anesthesia Other Findings   Reproductive/Obstetrics                             Anesthesia Physical Anesthesia Plan  ASA: II  Anesthesia Plan: General   Post-op Pain Management:    Induction: Intravenous  Airway Management Planned: Oral ETT  Additional Equipment:   Intra-op Plan:   Post-operative Plan: Extubation in OR  Informed Consent: I have reviewed the patients History and Physical, chart, labs and discussed the procedure including the risks, benefits and alternatives for the proposed anesthesia with the patient or authorized representative who has indicated his/her understanding and acceptance.   Dental advisory given  Plan Discussed with: CRNA and Anesthesiologist  Anesthesia Plan Comments:         Anesthesia Quick Evaluation

## 2015-03-31 NOTE — Anesthesia Postprocedure Evaluation (Signed)
Anesthesia Post Note  Patient: Shirley Hill  Procedure(s) Performed: Procedure(s) (LRB): EXPLORATORY LAPAROTOMY WITH REMOVAL OF RIGHT ADNEXAL MASSES (Right) RIGHT SALPINGO OOPHORECTOMY (Right)  Patient location during evaluation: [facility] Anesthesia Type: General Level of consciousness: awake and alert Pain management: satisfactory to patient Vital Signs Assessment: post-procedure vital signs reviewed and stable Respiratory status: spontaneous breathing and respiratory function stable Cardiovascular status: stable Postop Assessment: adequate PO intake Anesthetic complications: no    Last Vitals:  Filed Vitals:   03/31/15 1835 03/31/15 2056  BP: 105/57   Pulse: 60 73  Temp: 36.8 C   Resp: 14 13    Last Pain:  Filed Vitals:   03/31/15 2057  PainSc: 3                  Keili Hasten

## 2015-03-31 NOTE — Transfer of Care (Signed)
Immediate Anesthesia Transfer of Care Note  Patient: Shirley Hill  Procedure(s) Performed: Procedure(s): EXPLORATORY LAPAROTOMY WITH REMOVAL OF RIGHT ADNEXAL MASSES (Right) RIGHT SALPINGO OOPHORECTOMY (Right)  Patient Location: PACU  Anesthesia Type:General  Level of Consciousness: awake  Airway & Oxygen Therapy: Patient Spontanous Breathing  Post-op Assessment: Report given to PACU RN  Post vital signs: stable  Filed Vitals:   03/31/15 0631  BP: 0000000    Complications: No apparent anesthesia complications

## 2015-04-01 ENCOUNTER — Encounter (HOSPITAL_COMMUNITY): Payer: Self-pay | Admitting: Obstetrics and Gynecology

## 2015-04-01 LAB — CBC
HEMATOCRIT: 28.8 % — AB (ref 36.0–46.0)
HEMOGLOBIN: 9.6 g/dL — AB (ref 12.0–15.0)
MCH: 31.2 pg (ref 26.0–34.0)
MCHC: 33.3 g/dL (ref 30.0–36.0)
MCV: 93.5 fL (ref 78.0–100.0)
Platelets: 224 10*3/uL (ref 150–400)
RBC: 3.08 MIL/uL — ABNORMAL LOW (ref 3.87–5.11)
RDW: 14.3 % (ref 11.5–15.5)
WBC: 11.1 10*3/uL — ABNORMAL HIGH (ref 4.0–10.5)

## 2015-04-01 NOTE — Progress Notes (Signed)
1 Day Post-Op Procedure(s) (LRB): EXPLORATORY LAPAROTOMY WITH REMOVAL OF RIGHT ADNEXAL MASSES (Right) RIGHT SALPINGO OOPHORECTOMY (Right)  Subjective: Patient reports tolerating PO and no problems voiding.    Objective: I have reviewed patient's vital signs, intake and output, medications and labs.  General: alert, cooperative, appears stated age and no distress GI: soft, non-tender; bowel sounds normal; no masses,  no organomegaly Vaginal Bleeding: none  Assessment: s/p Procedure(s): EXPLORATORY LAPAROTOMY WITH REMOVAL OF RIGHT ADNEXAL MASSES (Right) RIGHT SALPINGO OOPHORECTOMY (Right): stable and progressing well  Plan: Advance diet Encourage ambulation Advance to PO medication Discontinue IV fluids  LOS: 1 day    Bishoy Cupp C 04/01/2015, 8:52 AM

## 2015-04-02 MED ORDER — OXYCODONE-ACETAMINOPHEN 5-325 MG PO TABS: 1.0000 | ORAL_TABLET | ORAL | Status: AC | PRN

## 2015-04-02 MED ORDER — ZOLPIDEM TARTRATE ER 12.5 MG PO TBCR: 12.5000 mg | EXTENDED_RELEASE_TABLET | Freq: Every evening | ORAL | Status: AC | PRN

## 2015-04-02 MED ORDER — IBUPROFEN 600 MG PO TABS: 600.0000 mg | ORAL_TABLET | Freq: Four times a day (QID) | ORAL | Status: AC | PRN

## 2015-04-02 NOTE — Discharge Summary (Signed)
Physician Discharge Summary  Patient ID: Shirley Hill MRN: [id number] DOB/AGE: 23-Sep-1970 45 y.o.  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 Her surgery was uncomplicated with 0000000 EBL and her post op care was unremarkable with quick return of bowel and bladder function, tolerating po meds and food and d/c d home on post op D 2  Consults: None  Significant Diagnostic Studies: labs: Hgb 9.6  Treatments: surgery: as above  Discharge Exam: Blood pressure 139/83, pulse 72, temperature 98.1 F (36.7 C), temperature source Oral, resp. rate 18, height 5\' 6"  (1.676 m), weight 156 lb (70.761 kg), SpO2 100 %. General appearance: alert, cooperative, appears stated age and no distress GI: soft, non-tender; bowel sounds normal; no masses,  no organomegaly Incision/Wound:CD&I  Disposition:   Discharge Instructions    Call MD for:  difficulty breathing, headache or visual disturbances    Complete by:  As directed      Call MD for:  persistant nausea and vomiting    Complete by:  As directed      Call MD for:  redness, tenderness, or signs of infection (pain, swelling, redness, odor or green/yellow discharge around incision site)    Complete by:  As directed      Call MD for:  severe uncontrolled pain    Complete by:  As directed      Call MD for:  temperature >100.4    Complete by:  As directed      Diet general    Complete by:  As directed      Driving Restrictions    Complete by:  As directed   No driving for 2 weeks     Increase activity slowly    Complete by:  As directed      Lifting restrictions    Complete by:  As directed   No lifting anything greater than 10 pounds (if you have to ask, don't lift it)     Sexual Activity  Restrictions    Complete by:  As directed   Nothing in the vagina for 6 weeks            Medication List    STOP taking these medications        zaleplon 10 MG capsule  Commonly known as:  SONATA      TAKE these medications        cyclobenzaprine 10 MG tablet  Commonly known as:  FLEXERIL  Take 10 mg by mouth 3 (three) times daily as needed for muscle spasms.     diclofenac 50 MG EC tablet  Commonly known as:  VOLTAREN  Take 50 mg by mouth as needed for moderate pain.     escitalopram 5 MG tablet  Commonly known as:  LEXAPRO  Take 5 mg by mouth as needed (anxiety).     ibuprofen 600 MG tablet  Commonly known as:  ADVIL,MOTRIN  Take 1 tablet (600 mg total) by mouth every 6 (six) hours as needed (mild pain).     oxyCODONE-acetaminophen 5-325 MG tablet  Commonly known as:  PERCOCET/ROXICET  Take 1-2 tablets by mouth every 4 (four) hours as needed for severe pain (moderate to severe pain (when tolerating fluids)).     zolpidem 10 MG tablet  Commonly known as:  AMBIEN  Take  10 mg by mouth at bedtime as needed for sleep.     zolpidem 12.5 MG CR tablet  Commonly known as:  AMBIEN CR  Take 1 tablet (12.5 mg total) by mouth at bedtime as needed for sleep.         Signed: Johany Hansman C 04/02/2015, 7:57 AM

## 2015-04-02 NOTE — Progress Notes (Signed)
2 Days Post-Op Procedure(s) (LRB): EXPLORATORY LAPAROTOMY WITH REMOVAL OF RIGHT ADNEXAL MASSES (Right) RIGHT SALPINGO OOPHORECTOMY (Right)  Subjective: Patient reports tolerating PO, + flatus, + BM and no problems voiding.    Objective: I have reviewed patient's vital signs, intake and output, medications and labs.  General: alert, cooperative, appears stated age and no distress GI: soft, non-tender; bowel sounds normal; no masses,  no organomegaly  Assessment: s/p Procedure(s): EXPLORATORY LAPAROTOMY WITH REMOVAL OF RIGHT ADNEXAL MASSES (Right) RIGHT SALPINGO OOPHORECTOMY (Right): stable and progressing well  Plan: Discharge home  LOS: 2 days    Shirley Hill C 04/02/2015, 7:54 AM

## 2015-04-02 NOTE — Progress Notes (Signed)
Pt is discharged in the care of Parents. Downstairs per ambulatory with N.T. Escort. Denies any pain or discomfort. Abdominal incision is clean and dry. Discharged instructions with Rx were given to pt Questions asked and answered. No equipment needed for home use.

## 2015-04-27 ENCOUNTER — Other Ambulatory Visit: Payer: Self-pay

## 2015-04-27 DIAGNOSIS — Z1231 Encounter for screening mammogram for malignant neoplasm of breast: Secondary | ICD-10-CM

## 2015-05-28 ENCOUNTER — Ambulatory Visit
Admission: RE | Admit: 2015-05-28 | Discharge: 2015-05-28 | Disposition: A | Discharge status: Home / Self Care | Payer: 59 | Source: Ambulatory Visit

## 2015-05-28 ENCOUNTER — Ambulatory Visit: Payer: 59

## 2015-05-28 DIAGNOSIS — Z1231 Encounter for screening mammogram for malignant neoplasm of breast: Secondary | ICD-10-CM

## 2016-04-27 ENCOUNTER — Other Ambulatory Visit: Payer: Self-pay | Admitting: Obstetrics and Gynecology

## 2016-04-27 DIAGNOSIS — Z1231 Encounter for screening mammogram for malignant neoplasm of breast: Secondary | ICD-10-CM

## 2016-05-10 DIAGNOSIS — J029 Acute pharyngitis, unspecified: Secondary | ICD-10-CM | POA: Diagnosis not present

## 2016-05-10 DIAGNOSIS — R0982 Postnasal drip: Secondary | ICD-10-CM | POA: Diagnosis not present

## 2016-05-30 ENCOUNTER — Ambulatory Visit
Admission: RE | Admit: 2016-05-30 | Discharge: 2016-05-30 | Disposition: A | Discharge status: Home / Self Care | Payer: 59 | Source: Ambulatory Visit | Attending: Obstetrics and Gynecology | Admitting: Obstetrics and Gynecology

## 2016-05-30 DIAGNOSIS — Z1231 Encounter for screening mammogram for malignant neoplasm of breast: Secondary | ICD-10-CM

## 2016-11-05 DIAGNOSIS — J019 Acute sinusitis, unspecified: Secondary | ICD-10-CM | POA: Diagnosis not present

## 2016-12-07 DIAGNOSIS — Z01419 Encounter for gynecological examination (general) (routine) without abnormal findings: Secondary | ICD-10-CM | POA: Diagnosis not present

## 2017-03-22 DIAGNOSIS — Z0000 Encounter for general adult medical examination without abnormal findings: Secondary | ICD-10-CM | POA: Diagnosis not present

## 2017-03-22 DIAGNOSIS — N390 Urinary tract infection, site not specified: Secondary | ICD-10-CM | POA: Diagnosis not present

## 2017-03-27 DIAGNOSIS — R030 Elevated blood-pressure reading, without diagnosis of hypertension: Secondary | ICD-10-CM | POA: Diagnosis not present

## 2017-03-27 DIAGNOSIS — Z0000 Encounter for general adult medical examination without abnormal findings: Secondary | ICD-10-CM | POA: Diagnosis not present

## 2017-05-02 ENCOUNTER — Other Ambulatory Visit: Payer: Self-pay | Admitting: Obstetrics and Gynecology

## 2017-05-02 DIAGNOSIS — Z1231 Encounter for screening mammogram for malignant neoplasm of breast: Secondary | ICD-10-CM

## 2017-05-31 ENCOUNTER — Ambulatory Visit: Payer: 59

## 2017-05-31 DIAGNOSIS — Z1231 Encounter for screening mammogram for malignant neoplasm of breast: Secondary | ICD-10-CM | POA: Diagnosis not present

## 2017-07-10 DIAGNOSIS — L300 Nummular dermatitis: Secondary | ICD-10-CM | POA: Diagnosis not present

## 2017-08-24 DIAGNOSIS — L300 Nummular dermatitis: Secondary | ICD-10-CM | POA: Diagnosis not present

## 2017-09-25 DIAGNOSIS — N3281 Overactive bladder: Secondary | ICD-10-CM | POA: Diagnosis not present

## 2017-09-25 DIAGNOSIS — R300 Dysuria: Secondary | ICD-10-CM | POA: Diagnosis not present

## 2017-12-12 DIAGNOSIS — Z6825 Body mass index (BMI) 25.0-25.9, adult: Secondary | ICD-10-CM | POA: Diagnosis not present

## 2017-12-12 DIAGNOSIS — Z01419 Encounter for gynecological examination (general) (routine) without abnormal findings: Secondary | ICD-10-CM | POA: Diagnosis not present

## 2018-04-10 DIAGNOSIS — Z0000 Encounter for general adult medical examination without abnormal findings: Secondary | ICD-10-CM | POA: Diagnosis not present

## 2018-04-10 DIAGNOSIS — Z131 Encounter for screening for diabetes mellitus: Secondary | ICD-10-CM | POA: Diagnosis not present

## 2018-04-16 DIAGNOSIS — Z0000 Encounter for general adult medical examination without abnormal findings: Secondary | ICD-10-CM | POA: Diagnosis not present

## 2018-04-16 DIAGNOSIS — R030 Elevated blood-pressure reading, without diagnosis of hypertension: Secondary | ICD-10-CM | POA: Diagnosis not present

## 2018-05-17 DIAGNOSIS — J069 Acute upper respiratory infection, unspecified: Secondary | ICD-10-CM | POA: Diagnosis not present

## 2018-12-24 IMAGING — MG 2D DIGITAL SCREENING BILATERAL MAMMOGRAM WITH CAD AND ADJUNCT TO
9 of 12 series · 9 of 28 positions shown · non-contrast
Comparison: Previous exam(s).

CLINICAL DATA: Screening.

EXAM:
2D DIGITAL SCREENING BILATERAL MAMMOGRAM WITH CAD AND ADJUNCT TOMO

[R MLO]
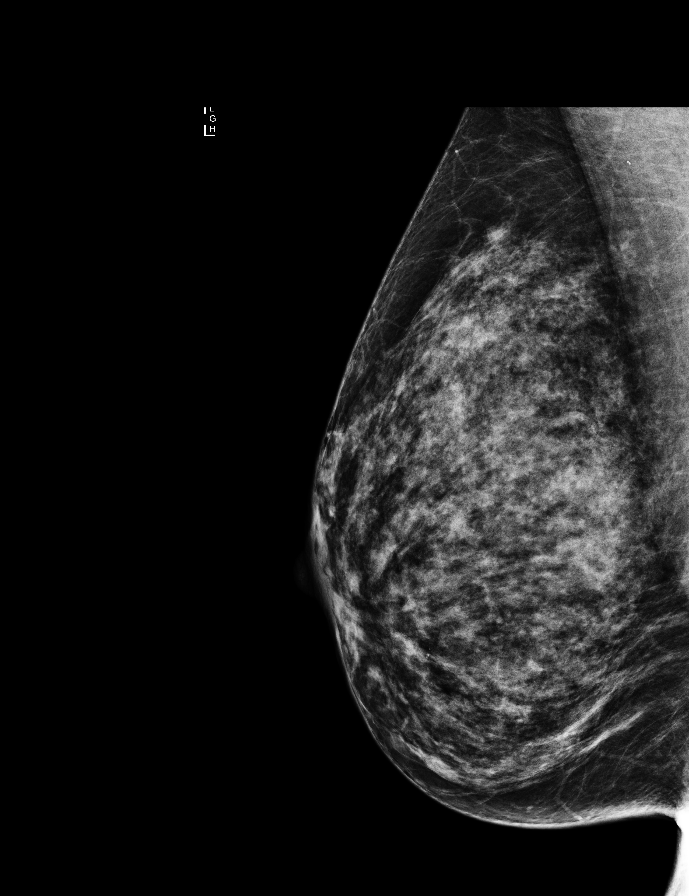

[L MLO]
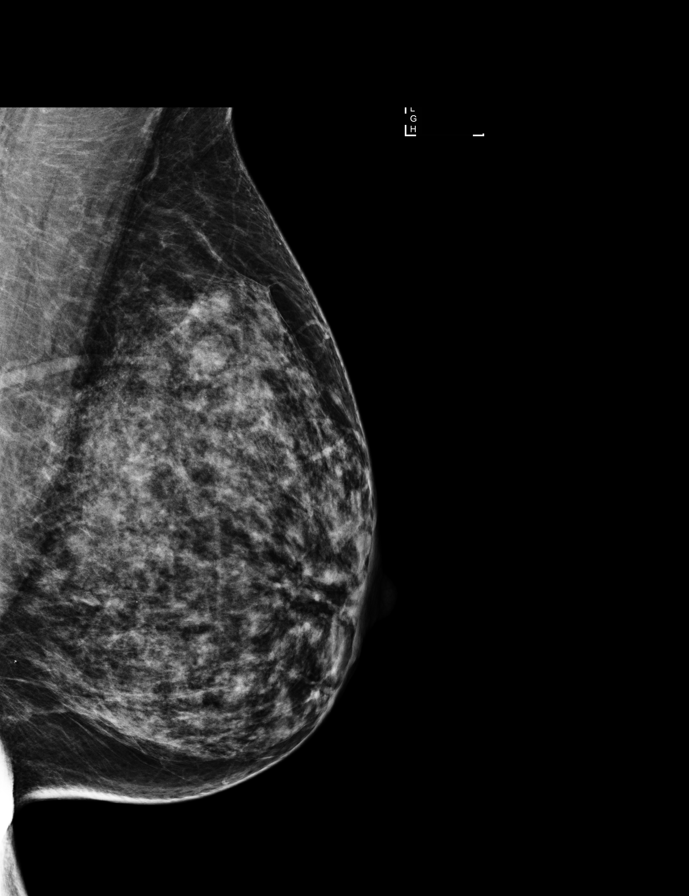

[R CC]
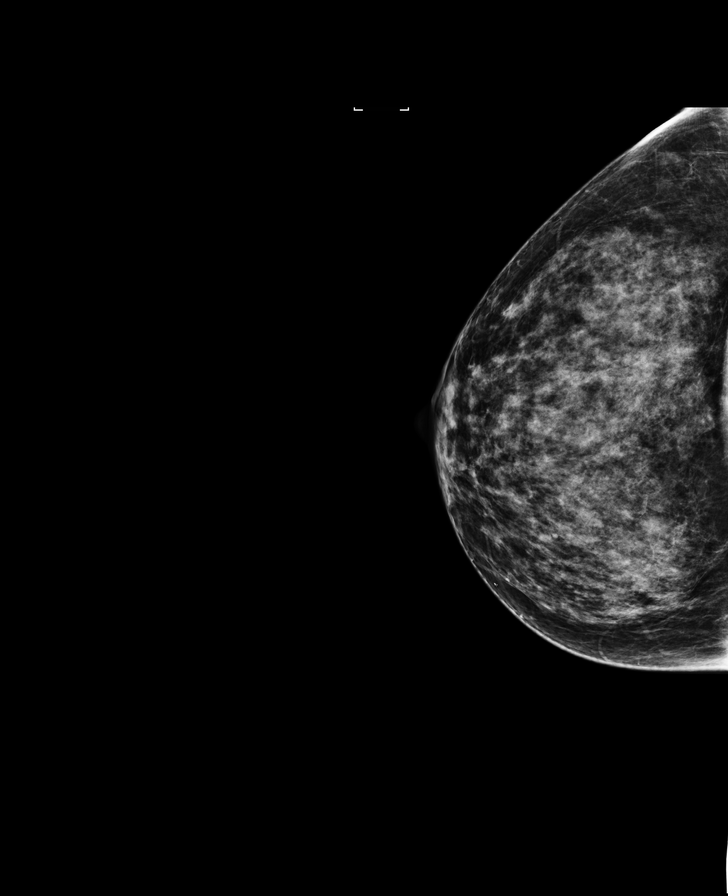

[L CC]
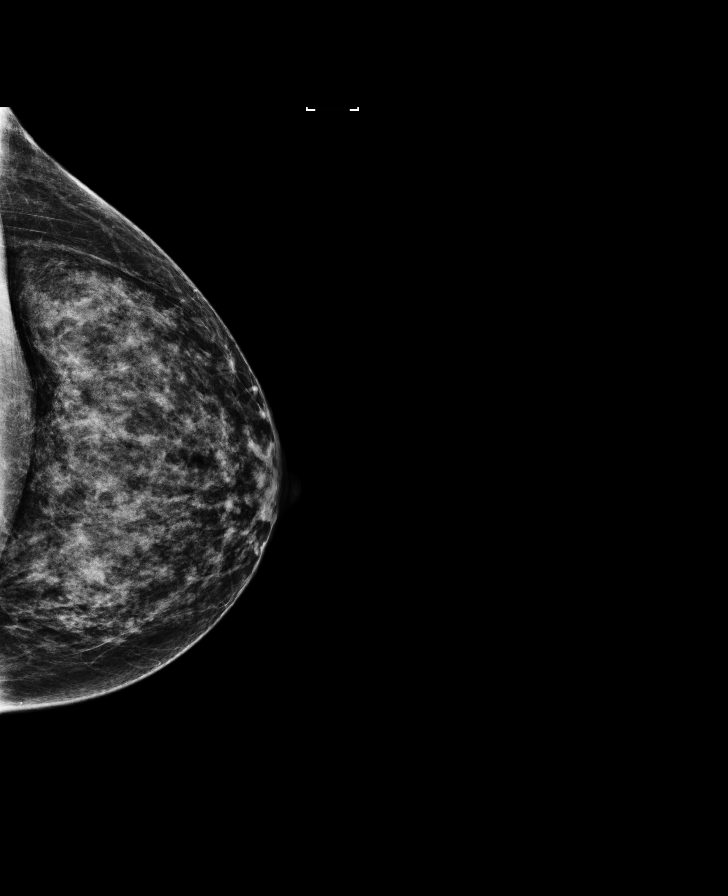

[L MLO synth-2D]
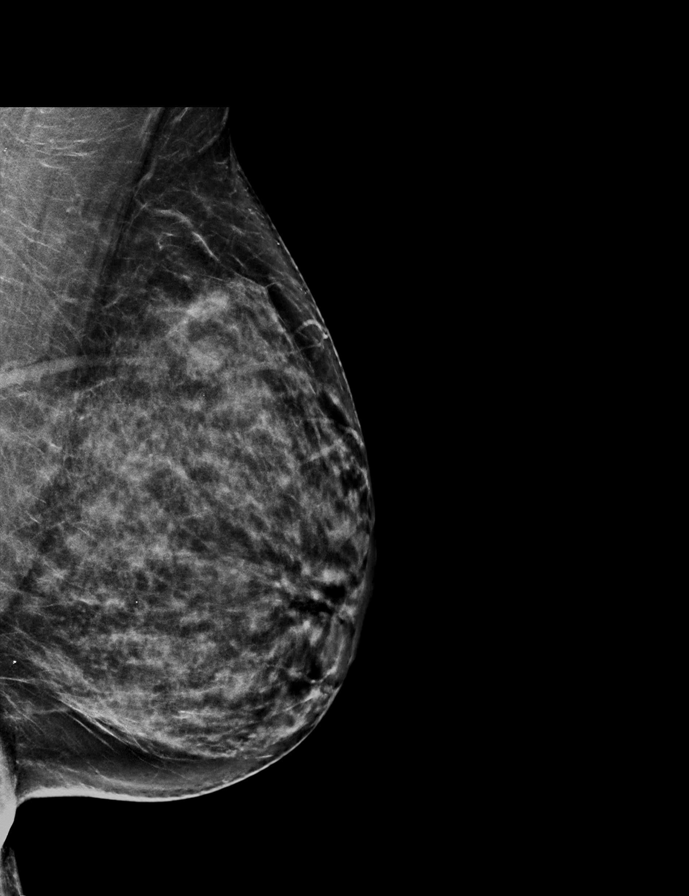

[L CC synth-2D]
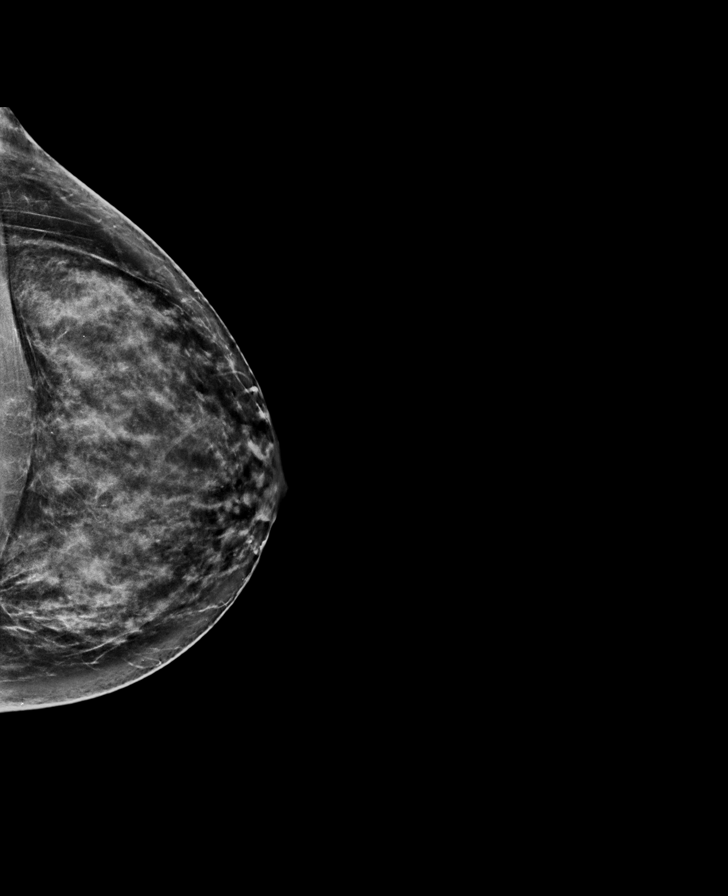

[R MLO synth-2D]
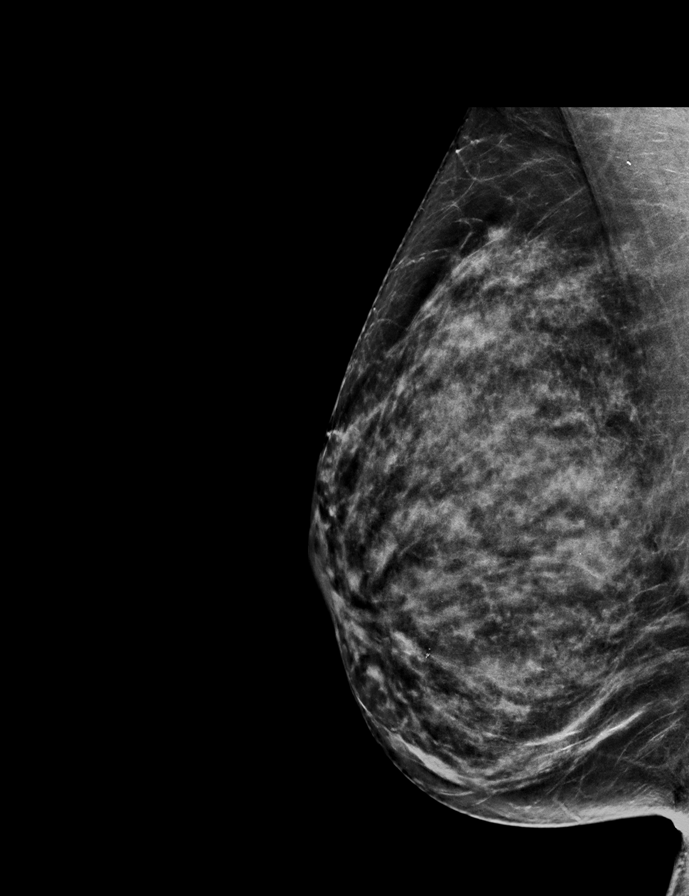

[R CC synth-2D]
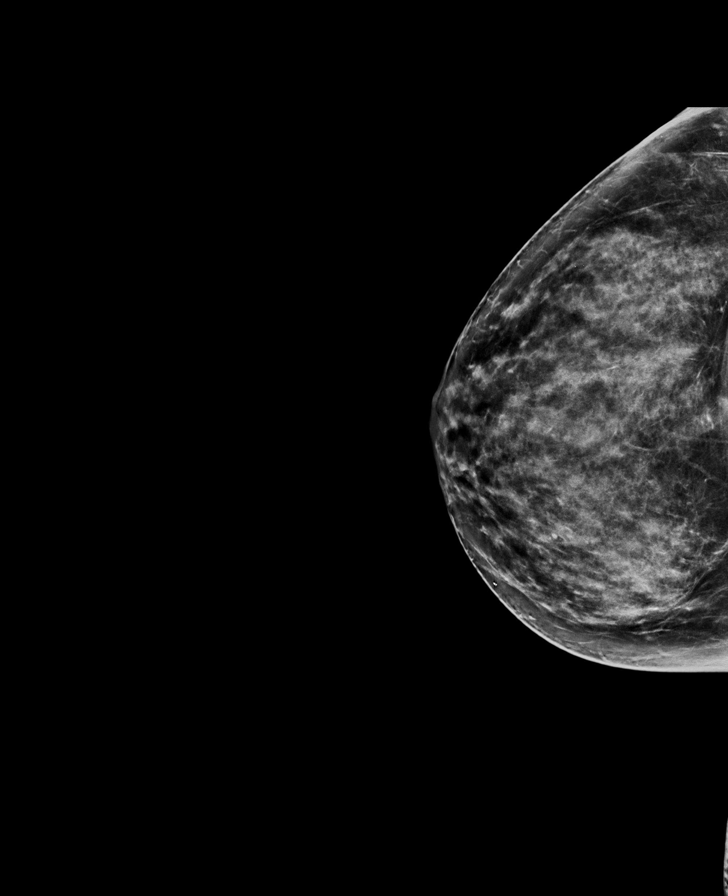

[L CC tomo · tomo slice 37/72.0]
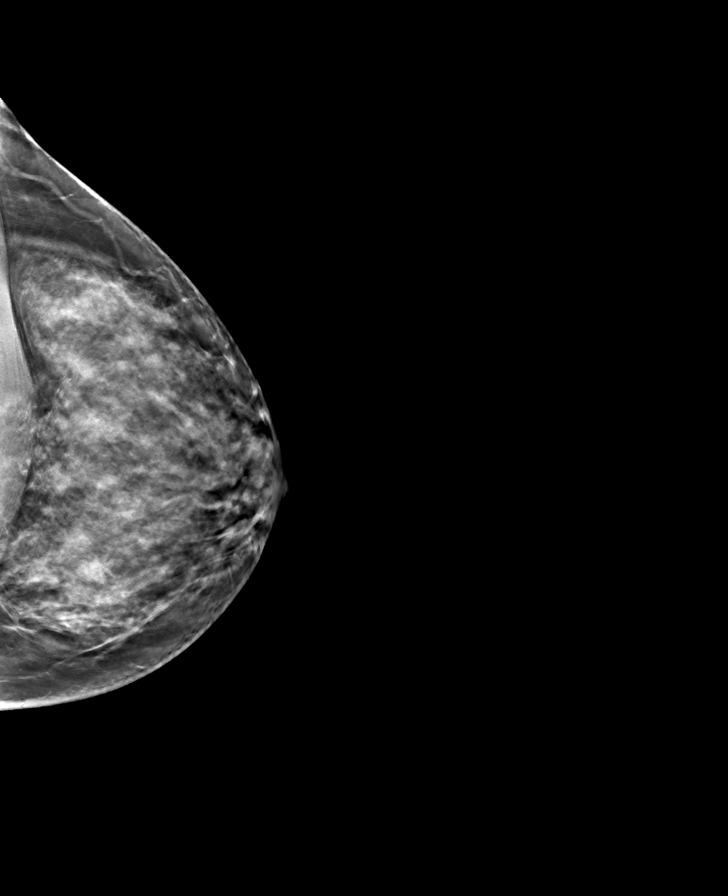

[9 of 28 positions shown; findings below may reference images not displayed]

ACR Breast Density Category c: The breast tissue is heterogeneously
dense, which may obscure small masses.
FINDINGS: There are no findings suspicious for malignancy. Images were
processed with CAD.
IMPRESSION: No mammographic evidence of malignancy. A result letter of this
screening mammogram will be mailed directly to the patient.

RECOMMENDATION:
Screening mammogram in one year. (Code:TN-[id number])

BI-RADS CATEGORY  1: Negative.

## 2019-06-13 ENCOUNTER — Emergency Department (HOSPITAL_COMMUNITY)
Admission: EM | Admit: 2019-06-13 | Discharge: 2019-06-13 | Disposition: A | Discharge status: Home / Self Care | Payer: 59 | Attending: Emergency Medicine | Admitting: Emergency Medicine

## 2019-06-13 ENCOUNTER — Encounter (HOSPITAL_COMMUNITY): Payer: Self-pay | Admitting: Emergency Medicine

## 2019-06-13 ENCOUNTER — Other Ambulatory Visit: Payer: Self-pay

## 2019-06-13 ENCOUNTER — Emergency Department (HOSPITAL_BASED_OUTPATIENT_CLINIC_OR_DEPARTMENT_OTHER): Payer: 59

## 2019-06-13 ENCOUNTER — Emergency Department (HOSPITAL_COMMUNITY): Payer: 59

## 2019-06-13 DIAGNOSIS — Z79899 Other long term (current) drug therapy: Secondary | ICD-10-CM | POA: Insufficient documentation

## 2019-06-13 DIAGNOSIS — R0789 Other chest pain: Secondary | ICD-10-CM | POA: Insufficient documentation

## 2019-06-13 DIAGNOSIS — R0602 Shortness of breath: Secondary | ICD-10-CM | POA: Diagnosis present

## 2019-06-13 DIAGNOSIS — I10 Essential (primary) hypertension: Secondary | ICD-10-CM | POA: Insufficient documentation

## 2019-06-13 DIAGNOSIS — M79606 Pain in leg, unspecified: Secondary | ICD-10-CM | POA: Diagnosis not present

## 2019-06-13 DIAGNOSIS — R609 Edema, unspecified: Secondary | ICD-10-CM | POA: Diagnosis not present

## 2019-06-13 LAB — TROPONIN I (HIGH SENSITIVITY)
Troponin I (High Sensitivity): <2 ng/L (ref <18)
Troponin I (High Sensitivity): <2 ng/L (ref <18)

## 2019-06-13 LAB — BASIC METABOLIC PANEL
Anion gap: 11 (ref 5–15)
BUN: 6 mg/dL (ref 6–20)
CO2: 23 mmol/L (ref 22–32)
Calcium: 9.9 mg/dL (ref 8.9–10.3)
Chloride: 104 mmol/L (ref 98–111)
Creatinine, Ser: 0.83 mg/dL (ref 0.44–1.00)
GFR calc Af Amer: >60 mL/min (ref >60)
GFR calc non Af Amer: >60 mL/min (ref >60)
Glucose, Bld: 82 mg/dL (ref 70–99)
Potassium: 3.7 mmol/L (ref 3.5–5.1)
Sodium: 138 mmol/L (ref 135–145)

## 2019-06-13 LAB — CBC
HCT: 36.1 % (ref 36.0–46.0)
Hemoglobin: 12 g/dL (ref 12.0–15.0)
MCH: 31.2 pg (ref 26.0–34.0)
MCHC: 33.2 g/dL (ref 30.0–36.0)
MCV: 93.8 fL (ref 80.0–100.0)
Platelets: 265 10*3/uL (ref 150–400)
RBC: 3.85 MIL/uL — ABNORMAL LOW (ref 3.87–5.11)
RDW: 14.5 % (ref 11.5–15.5)
WBC: 8.1 10*3/uL (ref 4.0–10.5)
nRBC: 0 % (ref 0.0–0.2)

## 2019-06-13 LAB — I-STAT BETA HCG BLOOD, ED (MC, WL, AP ONLY): I-stat hCG, quantitative: <5 m[IU]/mL (ref <5)

## 2019-06-13 MED ORDER — METOPROLOL TARTRATE 25 MG PO TABS
25.0000 mg | ORAL_TABLET | Freq: Once | ORAL | Status: AC
  Administered 2019-06-13: 25 mg via ORAL
  Filled 2019-06-13: qty 1

## 2019-06-13 MED ORDER — SODIUM CHLORIDE 0.9% FLUSH: 3.0000 mL | Freq: Once | INTRAVENOUS | Status: DC

## 2019-06-13 NOTE — ED Provider Notes (Signed)
[facility] EMERGENCY DEPARTMENT Provider Note   CSN: [id number] Arrival date & time: 06/13/19  [id number]     History Chief Complaint  Patient presents with  . Hypertension  . Shortness of Breath    Shirley Hill is a 49 y.o. female history of depression, here presenting with chest pain, bilateral leg pain, hypertension. Patient states that she woke up this morning and her blood pressure was in the 190s at home .  She also recently traveled to [place] and [place] and was complaining of bilateral calf pain. Has some subjective shortness of breath as well.  Denies any exposure to Covid or fever or cough.  Patient denies any heart problems.  The history is provided by the patient.       Past Medical History:  Diagnosis Date  . Anxiety   . Depression     Patient Active Problem List   Diagnosis Date Noted  . S/P laparotomy 03/31/2015    Past Surgical History:  Procedure Laterality Date  . LAPAROSCOPIC ASSISTED VAGINAL HYSTERECTOMY    . LAPAROTOMY Right 03/31/2015   Procedure: EXPLORATORY LAPAROTOMY WITH REMOVAL OF RIGHT ADNEXAL MASSES;  Surgeon: Louretta Shorten, MD;  Location: [facility];  Service: Gynecology;  Laterality: Right;  . MYOMECTOMY    . SALPINGOOPHORECTOMY Right 03/31/2015   Procedure: RIGHT SALPINGO OOPHORECTOMY;  Surgeon: Louretta Shorten, MD;  Location: [facility];  Service: Gynecology;  Laterality: Right;     OB History   No obstetric history on file.     No family history on file.  Social History   Tobacco Use  . Smoking status: Never Smoker  . Smokeless tobacco: Never Used  Substance Use Topics  . Alcohol use: Yes  . Drug use: No    Home Medications Prior to Admission medications   Medication Sig Start Date End Date Taking? Authorizing Provider  cyclobenzaprine (FLEXERIL) 10 MG tablet Take 10 mg by mouth 3 (three) times daily as needed for muscle spasms.    [provider]  diclofenac (VOLTAREN) 50 MG EC tablet Take 50 mg by mouth  as needed for moderate pain.    [provider]  escitalopram (LEXAPRO) 5 MG tablet Take 5 mg by mouth as needed (anxiety).    [provider]  ibuprofen (ADVIL,MOTRIN) 600 MG tablet Take 1 tablet (600 mg total) by mouth every 6 (six) hours as needed (mild pain). 04/02/15   Louretta Shorten, MD  oxyCODONE-acetaminophen (PERCOCET/ROXICET) 5-325 MG tablet Take 1-2 tablets by mouth every 4 (four) hours as needed for severe pain (moderate to severe pain (when tolerating fluids)). 04/02/15   Louretta Shorten, MD  zolpidem (AMBIEN CR) 12.5 MG CR tablet Take 1 tablet (12.5 mg total) by mouth at bedtime as needed for sleep. 04/02/15   Louretta Shorten, MD  zolpidem (AMBIEN) 10 MG tablet Take 10 mg by mouth at bedtime as needed for sleep.    [provider]    Allergies    Gadolinium derivatives  Review of Systems   Review of Systems  Respiratory: Positive for shortness of breath.   All other systems reviewed and are negative.   Physical Exam Updated Vital Signs BP (!) 141/91   Pulse 64   Temp 98.9 F (37.2 C) (Oral)   Resp 14   Ht 5\' 5"  (1.651 m)   Wt 76.2 kg   SpO2 100%   BMI 27.96 kg/m   Physical Exam Vitals and nursing note reviewed.  Constitutional:  Appearance: She is well-developed.     Comments: Anxious   HENT:     Head: Normocephalic.     Mouth/Throat:     Mouth: Mucous membranes are moist.  Eyes:     Pupils: Pupils are equal, round, and reactive to light.  Cardiovascular:     Rate and Rhythm: Normal rate.  Pulmonary:     Effort: Pulmonary effort is normal.     Breath sounds: Normal breath sounds.  Abdominal:     General: Bowel sounds are normal.     Palpations: Abdomen is soft.  Musculoskeletal:        General: Normal range of motion.     Cervical back: Normal range of motion and neck supple.     Comments: ? Calf tenderness bilaterally, R greater than left   Skin:    General: Skin is warm.     Capillary Refill: Capillary refill takes less than 2  seconds.  Neurological:     General: No focal deficit present.     Mental Status: She is alert and oriented to person, place, and time.  Psychiatric:        Mood and Affect: Mood normal.        Behavior: Behavior normal.     ED Results / Procedures / Treatments   Labs (all labs ordered are listed, but only abnormal results are displayed) Labs Reviewed  CBC - Abnormal; Notable for the following components:      Result Value   RBC 3.85 (*)    All other components within normal limits  BASIC METABOLIC PANEL  I-STAT BETA HCG BLOOD, ED ([facility], [facility], [facility] ONLY)  TROPONIN I (HIGH SENSITIVITY)  TROPONIN I (HIGH SENSITIVITY)    EKG EKG Interpretation  Date/Time:  Thursday June 13 2019 09:47:01 EDT Ventricular Rate:  82 PR Interval:  128 QRS Duration: 78 QT Interval:  372 QTC Calculation: 434 R Axis:   22 Text Interpretation: Normal sinus rhythm Cannot rule out Anterior infarct , age undetermined Abnormal ECG No significant change since last tracing Confirmed by Wandra Arthurs [id number]) on 06/13/2019 12:44:29 PM   Radiology DG Chest 2 View  Result Date: 06/13/2019 CLINICAL DATA:  Shortness of breath. EXAM: CHEST - 2 VIEW COMPARISON:  None. FINDINGS: The heart size and mediastinal contours are within normal limits. Both lungs are clear. No pneumothorax or pleural effusion is noted. The visualized skeletal structures are unremarkable. IMPRESSION: No active cardiopulmonary disease. Electronically Signed   By: Marijo Conception M.D.   On: 06/13/2019 10:34   VAS [place] LOWER EXTREMITY VENOUS (DVT) (ONLY [facility] & [facility])  Result Date: 06/13/2019  Lower Venous DVTStudy Indications: Edema.  Comparison Study: no priro Performing Technologist: Abram Sander RVS  Examination Guidelines: A complete evaluation includes B-mode imaging, spectral Doppler, color Doppler, and power Doppler as needed of all accessible portions of each vessel. Bilateral testing is considered an integral part of a complete examination. Limited  examinations for reoccurring indications may be performed as noted. The reflux portion of the exam is performed with the patient in reverse Trendelenburg.  +---------+---------------+---------+-----------+----------+--------------+ RIGHT    CompressibilityPhasicitySpontaneityPropertiesThrombus Aging +---------+---------------+---------+-----------+----------+--------------+ CFV      Full           Yes      Yes                                 +---------+---------------+---------+-----------+----------+--------------+ SFJ  Full                                                        +---------+---------------+---------+-----------+----------+--------------+ FV Prox  Full                                                        +---------+---------------+---------+-----------+----------+--------------+ FV Mid   Full                                                        +---------+---------------+---------+-----------+----------+--------------+ FV DistalFull                                                        +---------+---------------+---------+-----------+----------+--------------+ PFV      Full                                                        +---------+---------------+---------+-----------+----------+--------------+ POP      Full           Yes      Yes                                 +---------+---------------+---------+-----------+----------+--------------+ PTV      Full                                                        +---------+---------------+---------+-----------+----------+--------------+ PERO                                                  Not visualized +---------+---------------+---------+-----------+----------+--------------+   +---------+---------------+---------+-----------+----------+--------------+ LEFT     CompressibilityPhasicitySpontaneityPropertiesThrombus Aging  +---------+---------------+---------+-----------+----------+--------------+ CFV      Full           Yes      Yes                                 +---------+---------------+---------+-----------+----------+--------------+ SFJ      Full                                                        +---------+---------------+---------+-----------+----------+--------------+  FV Prox  Full                                                        +---------+---------------+---------+-----------+----------+--------------+ FV Mid   Full                                                        +---------+---------------+---------+-----------+----------+--------------+ FV DistalFull                                                        +---------+---------------+---------+-----------+----------+--------------+ PFV      Full                                                        +---------+---------------+---------+-----------+----------+--------------+ POP      Full           Yes      Yes                                 +---------+---------------+---------+-----------+----------+--------------+ PTV      Full                                                        +---------+---------------+---------+-----------+----------+--------------+ PERO                                                  Not visualized +---------+---------------+---------+-----------+----------+--------------+     Summary: BILATERAL: - No evidence of deep vein thrombosis seen in the lower extremities, bilaterally.   *See table(s) above for measurements and observations.    Preliminary     Procedures Procedures (including critical care time)  Medications Ordered in ED Medications  sodium chloride flush (NS) 0.9 % injection 3 mL (3 mLs Intravenous Not Given 06/13/19 1307)  metoprolol tartrate (LOPRESSOR) tablet 25 mg (25 mg Oral Given 06/13/19 1315)    ED Course  I have reviewed the triage vital  signs and the nursing notes.  Pertinent labs & imaging results that were available during my care of the patient were reviewed by me and considered in my medical decision making (see chart for details).    MDM Rules/Calculators/A&P                      CHARNESSA METZNER is a 49 y.o. female who presented with hypertension, subjective shortness of breath and bilateral leg swelling.  She did have recent travels consider DVT.  She is nontachycardic and  I doubt she has PE.  She may have symptomatic hypertension or panic attacks.  Will get labs and troponin x2.  We will also get bilateral DVT study.  2:47 PM DVT study negative. Trop neg x 2. Labs unremarkable.  Blood pressure down to the 140s.  Stable for discharge.  Final Clinical Impression(s) / ED Diagnoses Final diagnoses:  None    Rx / DC Orders ED Discharge Orders    None       Drenda Freeze, MD 06/13/19 1447

## 2019-06-13 NOTE — Progress Notes (Signed)
Lower extremity venous has been completed.   Preliminary results in CV Proc.   Abram Sander 06/13/2019 2:15 PM

## 2019-06-13 NOTE — Discharge Instructions (Signed)
Monitor your blood pressure at home.   See your doctor in a week   Return to ER if you have worse chest pain, trouble breathing, worse leg pain

## 2019-06-13 NOTE — ED Triage Notes (Signed)
Pt reports high blood pressure this morning in the 190s at home w/o hx of htn, states that she has had some pain on the back of both legs for the past few days. She also endorses sob, went on a recent trip to [place] last week. Endorses some swelling to R lower leg. Denies chest pain

## 2022-01-06 IMAGING — DX DG CHEST 2V
2 series · 2 of 2 positions shown · non-contrast
Comparison: None.

CLINICAL DATA: Shortness of breath.

EXAM:
CHEST - 2 VIEW

[w chest pa]
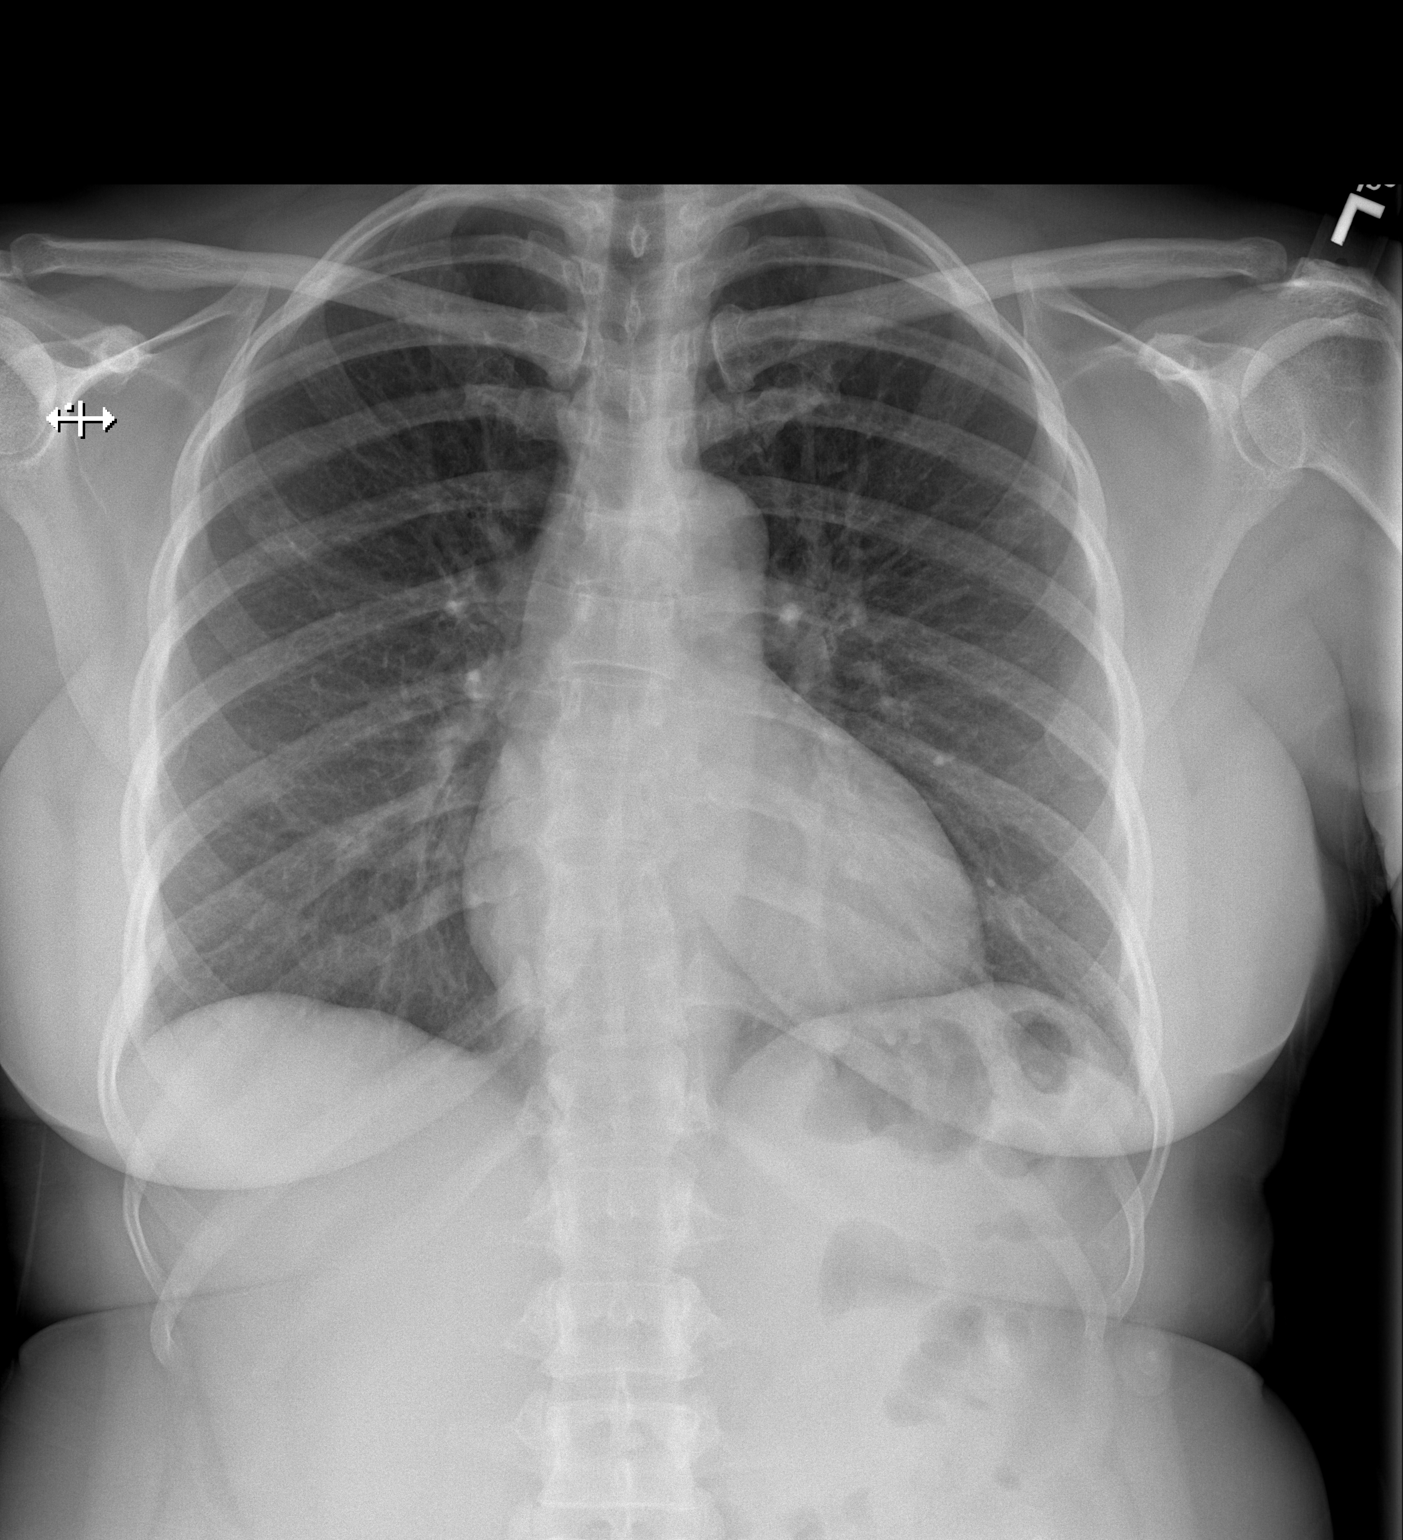

[w chest lat]
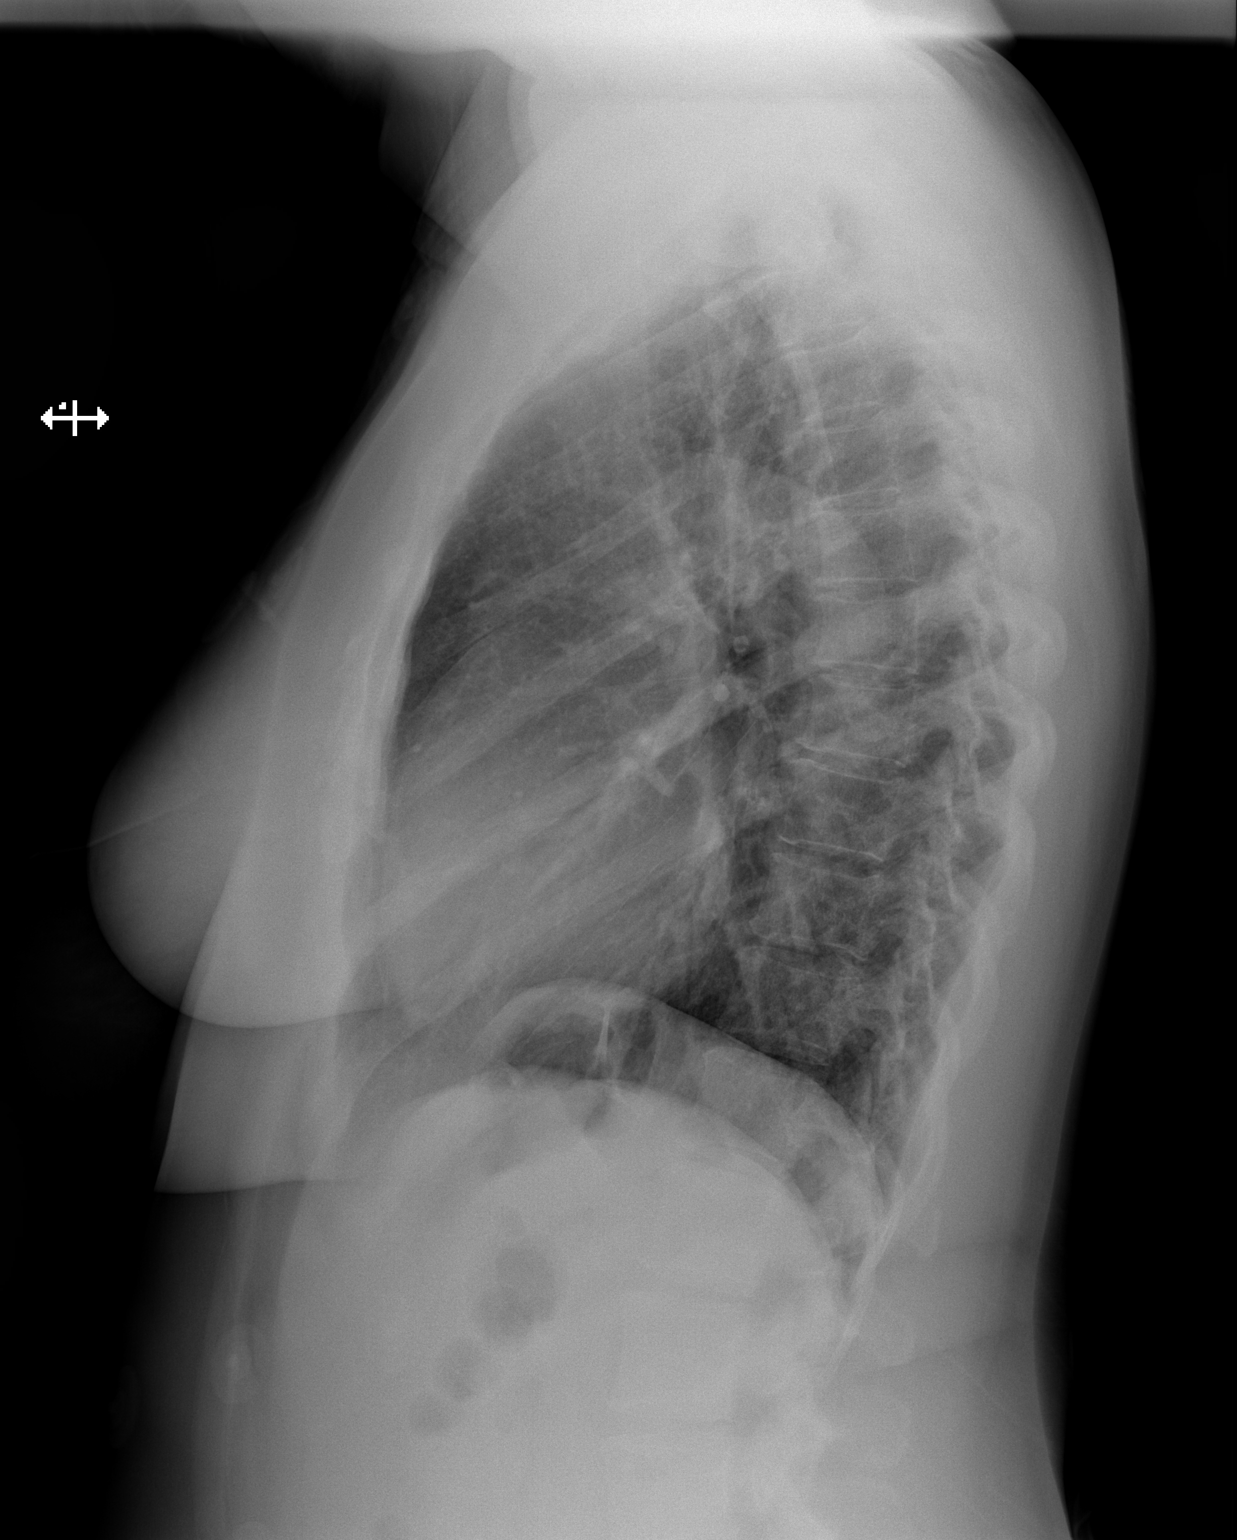

[2 of 2 positions shown; findings below may reference images not displayed]

FINDINGS: The heart size and mediastinal contours are within normal limits.
Both lungs are clear. No pneumothorax or pleural effusion is noted.
The visualized skeletal structures are unremarkable.
IMPRESSION: No active cardiopulmonary disease.

## 2023-06-19 ENCOUNTER — Other Ambulatory Visit: Payer: Self-pay | Admitting: Obstetrics and Gynecology

## 2023-06-19 DIAGNOSIS — R928 Other abnormal and inconclusive findings on diagnostic imaging of breast: Secondary | ICD-10-CM

## 2023-06-27 ENCOUNTER — Ambulatory Visit

## 2023-06-27 ENCOUNTER — Ambulatory Visit
Admission: RE | Admit: 2023-06-27 | Discharge: 2023-06-27 | Disposition: A | Discharge status: Home / Self Care | Source: Ambulatory Visit | Attending: Obstetrics and Gynecology | Admitting: Obstetrics and Gynecology

## 2023-06-27 DIAGNOSIS — R928 Other abnormal and inconclusive findings on diagnostic imaging of breast: Secondary | ICD-10-CM
